# Patient Record
Sex: Female | Born: 1970 | Hispanic: No | Marital: Married | Smoking: Never smoker
Health system: Southern US, Community
[De-identification: ages and names within clinical notes are randomized; demographics above are authoritative.]

## PROBLEM LIST (undated history)

## (undated) DIAGNOSIS — E739 Lactose intolerance, unspecified: Secondary | ICD-10-CM

## (undated) DIAGNOSIS — D649 Anemia, unspecified: Secondary | ICD-10-CM

## (undated) DIAGNOSIS — E559 Vitamin D deficiency, unspecified: Secondary | ICD-10-CM

## (undated) DIAGNOSIS — J45909 Unspecified asthma, uncomplicated: Secondary | ICD-10-CM

## (undated) DIAGNOSIS — N809 Endometriosis, unspecified: Secondary | ICD-10-CM

## (undated) DIAGNOSIS — D219 Benign neoplasm of connective and other soft tissue, unspecified: Secondary | ICD-10-CM

## (undated) HISTORY — DX: Endometriosis, unspecified: N80.9

## (undated) HISTORY — DX: Unspecified asthma, uncomplicated: J45.909

## (undated) HISTORY — DX: Vitamin D deficiency, unspecified: E55.9

## (undated) HISTORY — DX: Benign neoplasm of connective and other soft tissue, unspecified: D21.9

## (undated) HISTORY — PX: ABDOMINAL HYSTERECTOMY: SHX81

## (undated) HISTORY — DX: Lactose intolerance, unspecified: E73.9

## (undated) HISTORY — DX: Anemia, unspecified: D64.9

---
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
## 2002-09-25 HISTORY — PX: CHOLECYSTECTOMY, LAPAROSCOPIC: SHX56

## 2006-02-01 ENCOUNTER — Ambulatory Visit: Payer: Self-pay | Admitting: Internal Medicine

## 2007-05-17 ENCOUNTER — Ambulatory Visit: Payer: Self-pay | Admitting: Internal Medicine

## 2007-05-24 ENCOUNTER — Ambulatory Visit: Payer: Self-pay | Admitting: Internal Medicine

## 2009-08-06 ENCOUNTER — Ambulatory Visit: Payer: Self-pay | Admitting: Internal Medicine

## 2009-09-25 HISTORY — PX: COLONOSCOPY: SHX174

## 2009-12-24 ENCOUNTER — Ambulatory Visit: Payer: Self-pay | Admitting: Unknown Physician Specialty

## 2010-10-21 ENCOUNTER — Ambulatory Visit: Payer: Self-pay

## 2010-11-01 ENCOUNTER — Ambulatory Visit: Payer: Self-pay

## 2011-12-11 ENCOUNTER — Ambulatory Visit: Payer: Self-pay

## 2011-12-15 ENCOUNTER — Ambulatory Visit: Payer: Self-pay

## 2012-12-11 ENCOUNTER — Ambulatory Visit: Payer: Self-pay

## 2013-09-10 ENCOUNTER — Ambulatory Visit: Payer: Self-pay | Admitting: Obstetrics and Gynecology

## 2013-09-10 LAB — WBC: WBC: 8.6 10*3/uL (ref 3.6–11.0)

## 2013-09-10 LAB — BASIC METABOLIC PANEL
Anion Gap: 6 — ABNORMAL LOW (ref 7–16)
Calcium, Total: 8.8 mg/dL (ref 8.5–10.1)
Chloride: 108 mmol/L — ABNORMAL HIGH (ref 98–107)
EGFR (African American): >60
Glucose: 77 mg/dL (ref 65–99)
Osmolality: 276 (ref 275–301)
Potassium: 3.5 mmol/L (ref 3.5–5.1)

## 2013-09-10 LAB — PREGNANCY, URINE: Pregnancy Test, Urine: NEGATIVE m[IU]/mL

## 2013-09-23 ENCOUNTER — Ambulatory Visit: Payer: Self-pay | Admitting: Obstetrics and Gynecology

## 2013-09-24 HISTORY — PX: LAPAROSCOPIC SUPRACERVICAL HYSTERECTOMY: SUR797

## 2013-09-24 LAB — CBC
HGB: 11 g/dL — ABNORMAL LOW (ref 12.0–16.0)
MCH: 30.3 pg (ref 26.0–34.0)
MCV: 88 fL (ref 80–100)
Platelet: 218 10*3/uL (ref 150–440)
RBC: 3.63 10*6/uL — ABNORMAL LOW (ref 3.80–5.20)

## 2013-10-28 HISTORY — PX: OOPHORECTOMY: SHX86

## 2013-11-06 ENCOUNTER — Observation Stay: Payer: Self-pay | Admitting: Obstetrics and Gynecology

## 2013-11-06 LAB — CBC
HCT: 41.6 % (ref 35.0–47.0)
HGB: 14.4 g/dL (ref 12.0–16.0)
MCH: 30.6 pg (ref 26.0–34.0)
MCHC: 34.6 g/dL (ref 32.0–36.0)
MCV: 89 fL (ref 80–100)
Platelet: 226 10*3/uL (ref 150–440)
RBC: 4.7 10*6/uL (ref 3.80–5.20)
RDW: 14 % (ref 11.5–14.5)
WBC: 10.9 10*3/uL (ref 3.6–11.0)

## 2013-11-06 LAB — URINALYSIS, COMPLETE
BILIRUBIN, UR: NEGATIVE
Bacteria: NONE SEEN
Blood: NEGATIVE
GLUCOSE, UR: NEGATIVE mg/dL (ref 0–75)
Leukocyte Esterase: NEGATIVE
Nitrite: NEGATIVE
PH: 7 (ref 4.5–8.0)
Protein: NEGATIVE
SPECIFIC GRAVITY: 1.018 (ref 1.003–1.030)

## 2013-11-06 LAB — COMPREHENSIVE METABOLIC PANEL
Albumin: 3.6 g/dL (ref 3.4–5.0)
Alkaline Phosphatase: 74 U/L
Anion Gap: 9 (ref 7–16)
BILIRUBIN TOTAL: 0.6 mg/dL (ref 0.2–1.0)
BUN: 12 mg/dL (ref 7–18)
Calcium, Total: 8.7 mg/dL (ref 8.5–10.1)
Chloride: 105 mmol/L (ref 98–107)
Co2: 22 mmol/L (ref 21–32)
Creatinine: 0.92 mg/dL (ref 0.60–1.30)
EGFR (African American): >60
EGFR (Non-African Amer.): >60
Glucose: 101 mg/dL — ABNORMAL HIGH (ref 65–99)
Osmolality: 272 (ref 275–301)
POTASSIUM: 3 mmol/L — AB (ref 3.5–5.1)
SGOT(AST): 22 U/L (ref 15–37)
SGPT (ALT): 21 U/L (ref 12–78)
SODIUM: 136 mmol/L (ref 136–145)
TOTAL PROTEIN: 7.4 g/dL (ref 6.4–8.2)

## 2013-11-06 LAB — POTASSIUM: POTASSIUM: 3.7 mmol/L (ref 3.5–5.1)

## 2013-11-06 LAB — PREGNANCY, URINE: PREGNANCY TEST, URINE: NEGATIVE m[IU]/mL

## 2013-11-06 LAB — LIPASE, BLOOD: Lipase: 74 U/L (ref 73–393)

## 2013-11-07 LAB — HEMATOCRIT: HCT: 33.7 % — ABNORMAL LOW (ref 35.0–47.0)

## 2013-11-12 LAB — PATHOLOGY REPORT

## 2013-12-18 ENCOUNTER — Ambulatory Visit: Payer: Self-pay

## 2014-03-25 IMAGING — US US ARTFLOW
1 series · 13 of 16 positions shown · non-contrast
Comparison: CT Abdomen and Pelvis 11/06/2013. Pelvis ultrasound
05/17/2007.

CLINICAL DATA: 42-year-old female with pelvic free fluid and
possible abnormal left ovary by CT performed for abdominal and
pelvic pain. Initial encounter. Prior hysterectomy.

EXAM:
TRANSABDOMINAL AND TRANSVAGINAL ULTRASOUND OF PELVIS
DOPPLER ULTRASOUND OF OVARIES
TECHNIQUE: Both transabdominal and transvaginal ultrasound examinations of the
pelvis were performed. Transabdominal technique was performed for
global imaging of the pelvis including uterus, ovaries, adnexal
regions, and pelvic cul-de-sac.
It was necessary to proceed with endovaginal exam following the
transabdominal exam to visualize the ovaries. Color and duplex
Doppler ultrasound was utilized to evaluate blood flow to the
ovaries.

[Series 1: us artflow · 0.28mm/px · 13 of 117 slices shown]
[im 1/117]
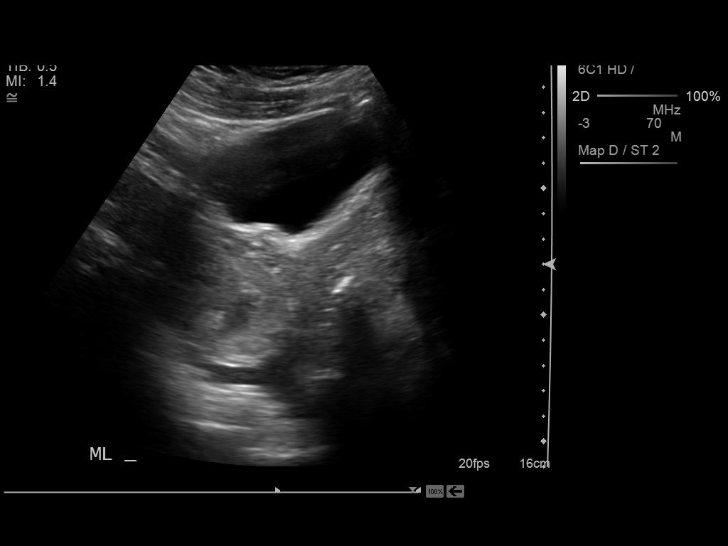
[im 8/117]
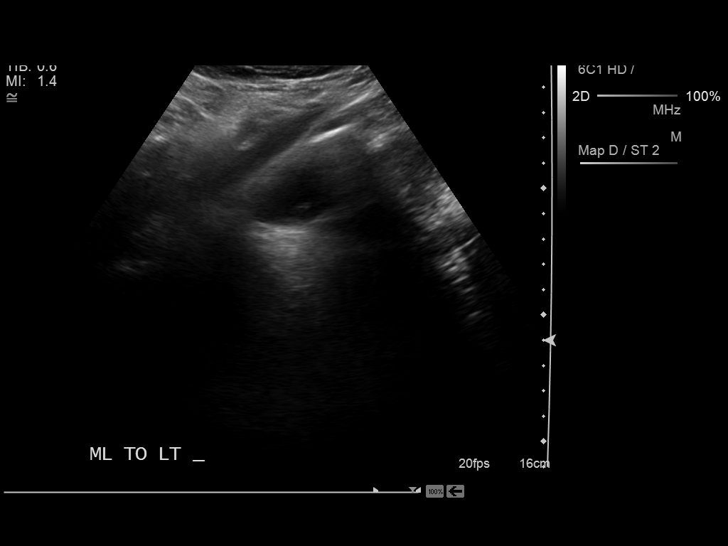
[im 24/117]
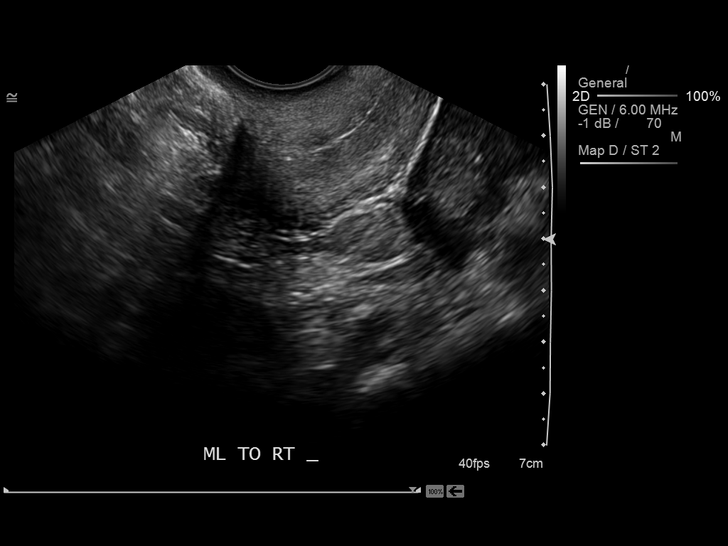
[im 31/117]
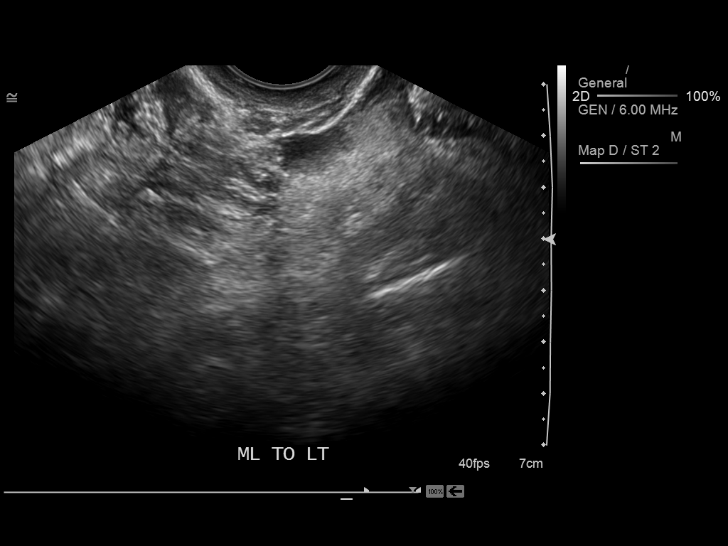
[im 39/117]
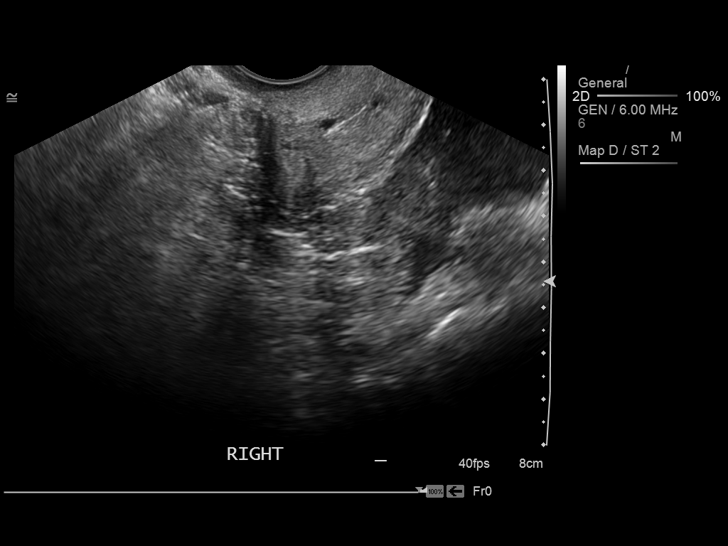
[im 47/117]
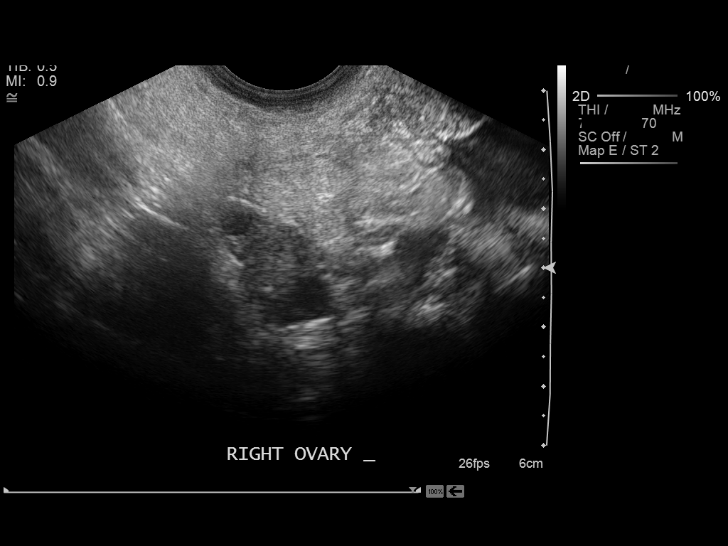
[im 62/117]
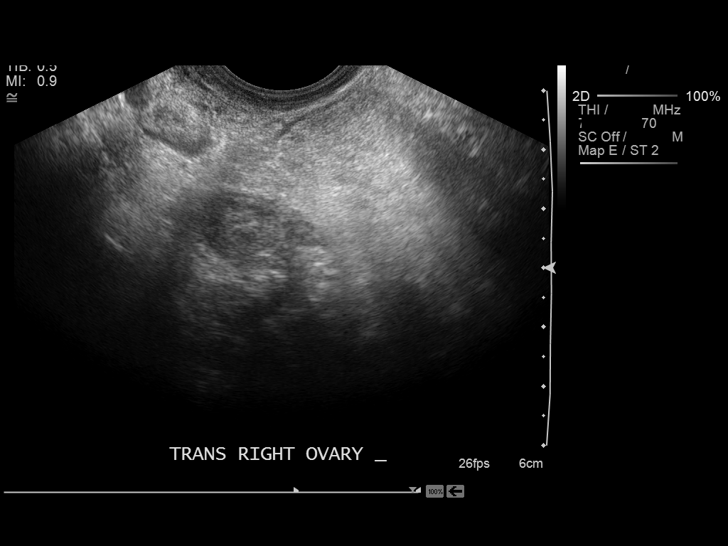
[im 70/117]
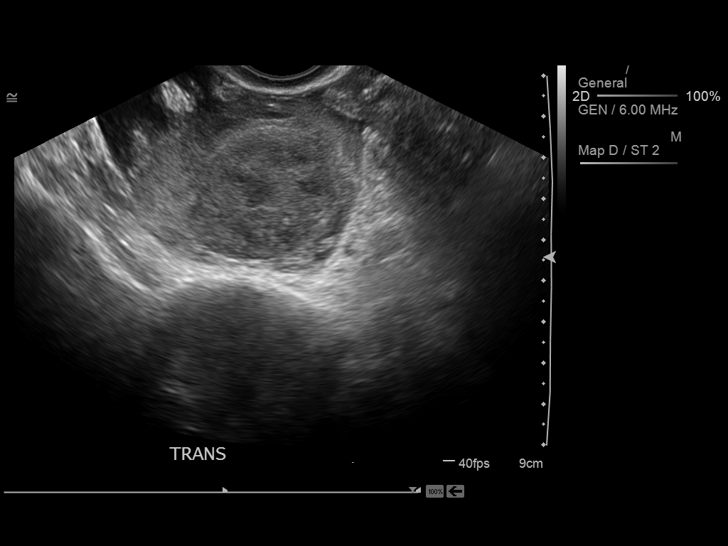
[im 78/117]
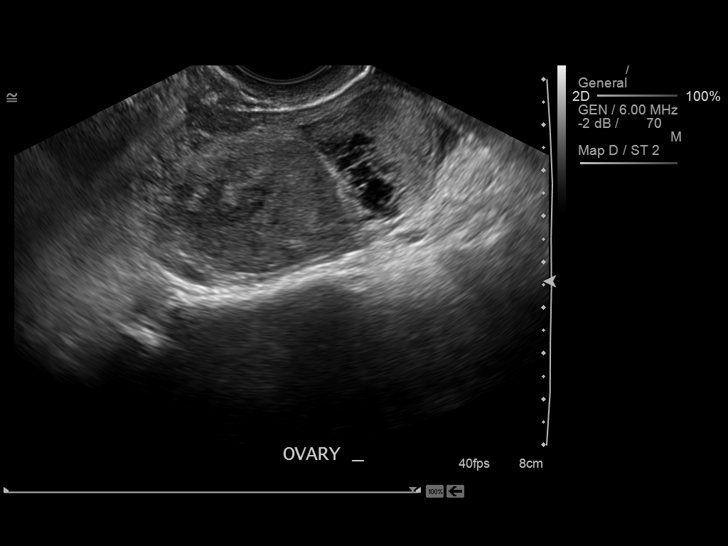
[im 86/117]
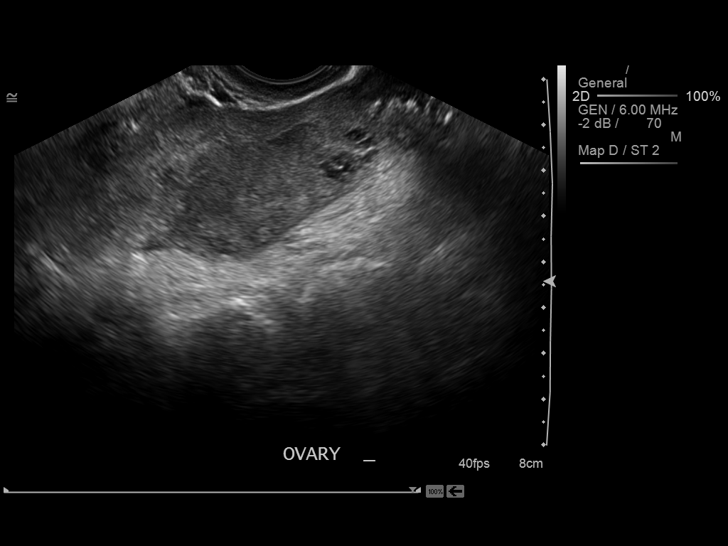
[im 93/117]
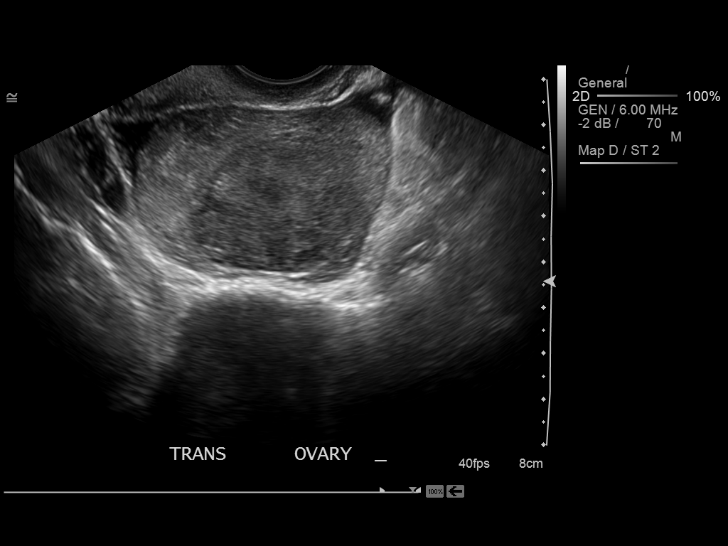
[im 109/117]
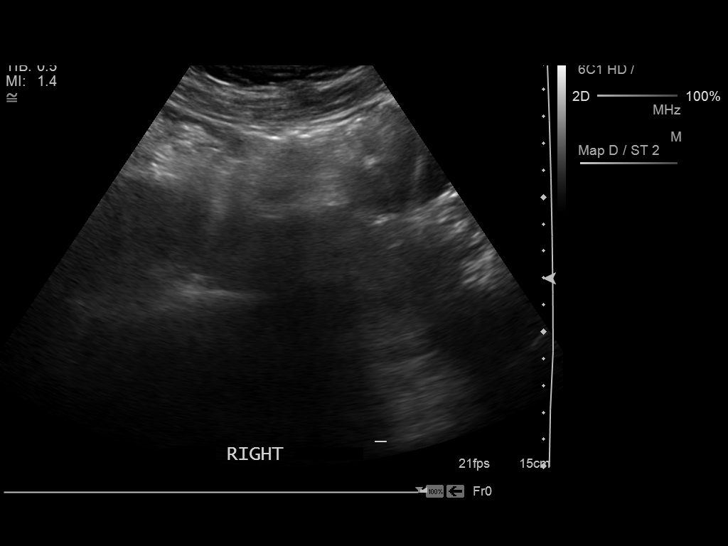
[im 117/117]
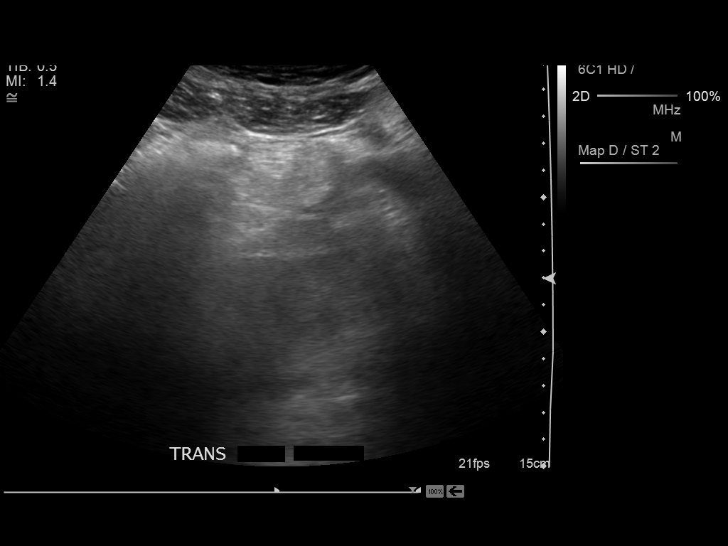

[13 of 16 positions shown; findings below may reference images not displayed]

FINDINGS: Uterus

Measurements: Surgically absent.

Endometrium

Thickness: Surgically absent.

Right ovary

Measurements: 2.5 x 1.3 x 2.0 cm. Normal appearance/no adnexal mass.
Normal low resistance arterial and venous spectral Doppler
waveforms.

Left ovary

Measurements: 7.3 x 3.5 x 5.8 cm. Enlarged and very complex/
heterogeneous in appearance. Color Doppler and spectral not blur
analysis demonstrates detectable blood flow primarily along the
periphery. Arterial waveforms are very low resistance (image 87).
Centrally there is little to no evidence of vascularity (image 102).

Other findings

Moderate volume of fluid with low level internal echoes.
IMPRESSION: 1. Markedly abnormal left ovary is enlarged (up to 7 cm) and very
complex / heterogeneous. And although there is some preserved
peripheral vascularity, the detected arterial waveform is
asymmetric, and there is no central vascularity identified. Favor
sequelae of left ovarian torsion.
Less likely consideration is a large (6 cm) hemorrhagic cyst. Left
ovarian neoplasm is less likely still given the absence of central
vascularity.
Recommend GYN surgical evaluation.
2. Moderate volume pelvic free fluid.
3. Small, normal right ovary.  Uterus surgically absent.
Study discussed by telephone with Dzida Sarajlija, DO on

## 2014-03-25 IMAGING — CT CT ABD-PELV W/ CM
2 of 5 series · 16 of 46 positions shown, 18 images · IV contrast (isovue)
Comparison: Pelvic ultrasound May 17, 2007

CLINICAL DATA: Abdominal and pelvic pain

EXAM:
CT ABDOMEN AND PELVIS WITH CONTRAST
TECHNIQUE: Multidetector CT imaging of the abdomen and pelvis was performed
using the standard protocol following bolus administration of
intravenous contrast.
CONTRAST:  100 mL Isovue 370 nonionic

[Series 2: routine abd pel with · axial · 0.71mm/px · z∈[-799,-454]mm · 13 of 81 slices shown, 15 images]
[im 6/81  soft-tissue]
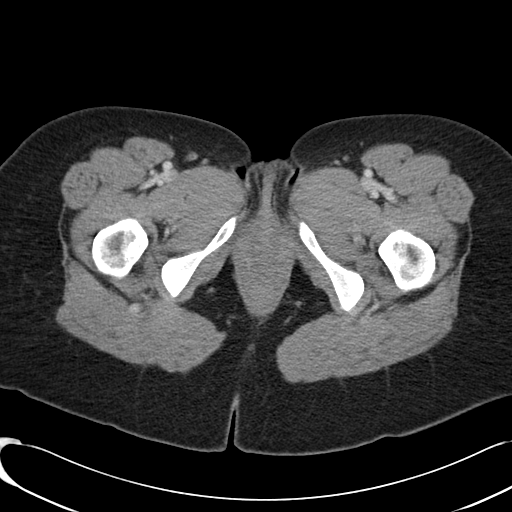
[im 6/81  bone]
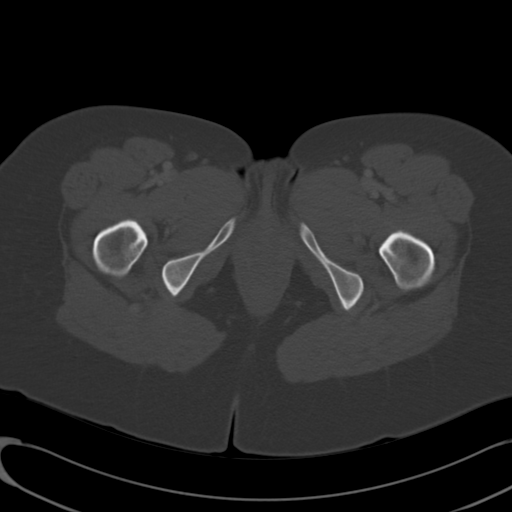
[im 12/81  soft-tissue]
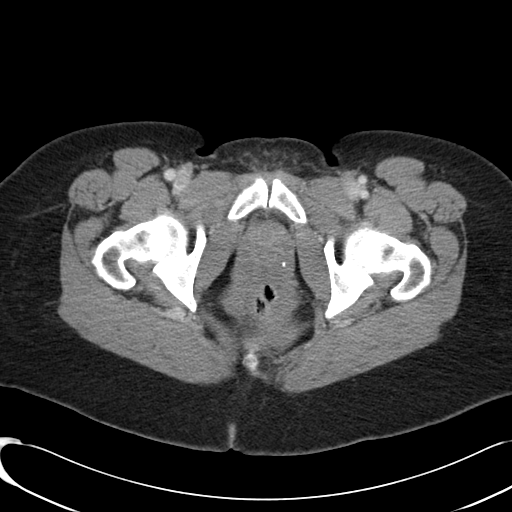
[im 18/81  soft-tissue]
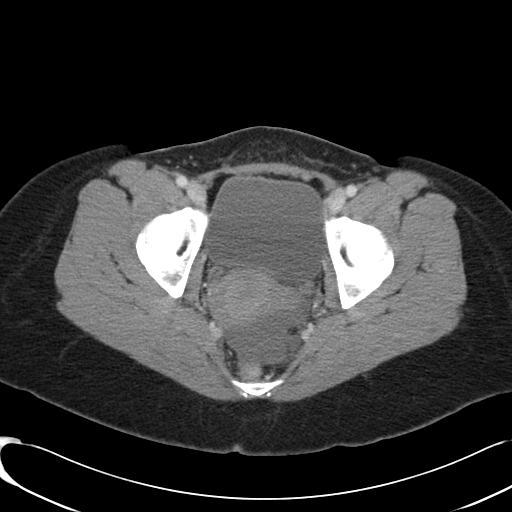
[im 23/81  soft-tissue]
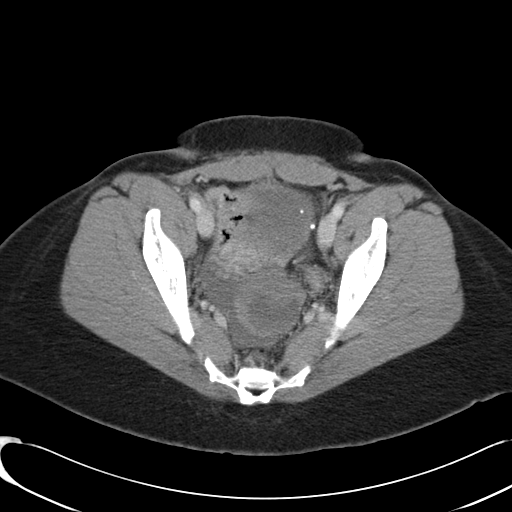
[im 29/81  soft-tissue]
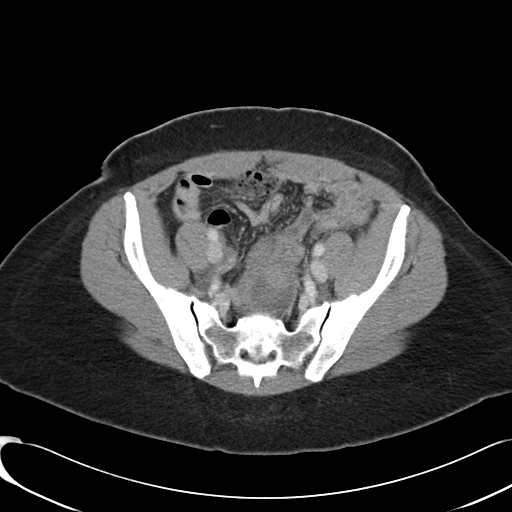
[im 35/81  soft-tissue]
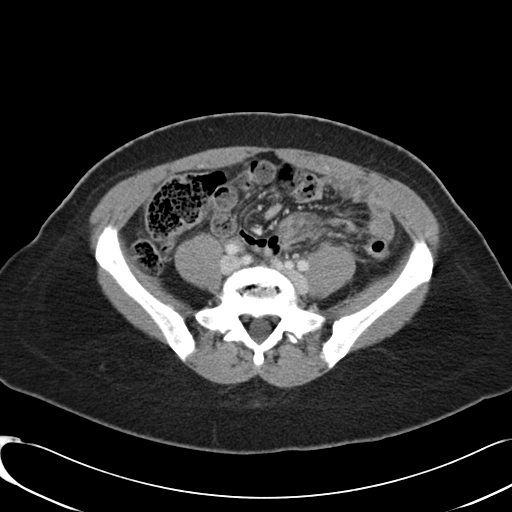
[im 41/81  soft-tissue]
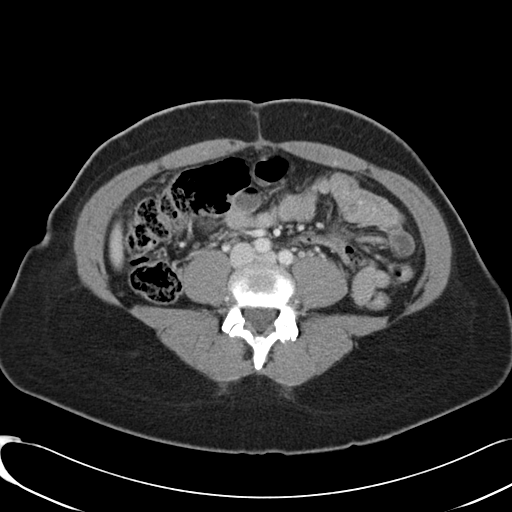
[im 46/81  soft-tissue]
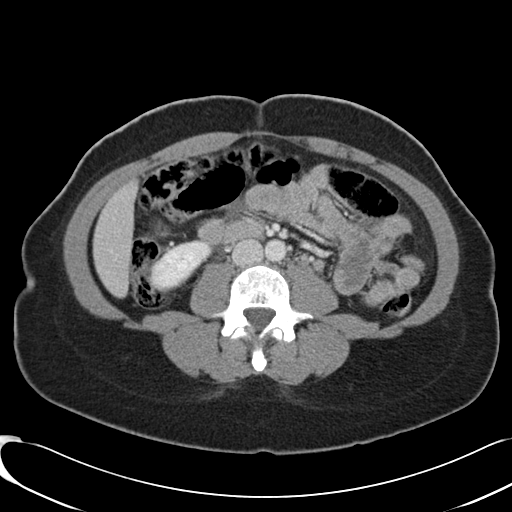
[im 52/81  soft-tissue]
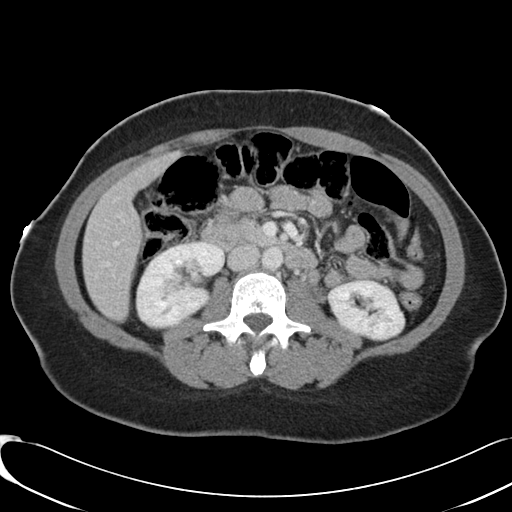
[im 52/81  bone]
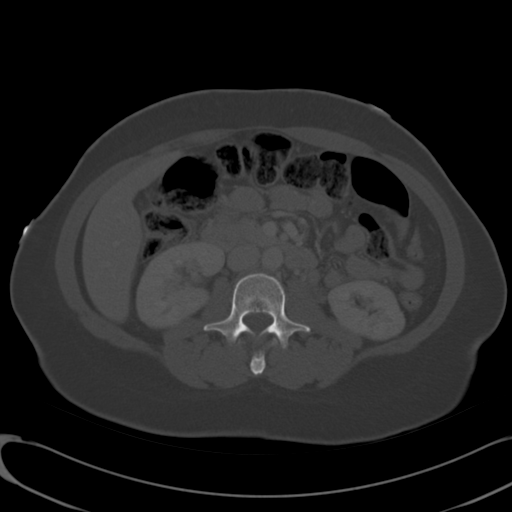
[im 58/81  soft-tissue]
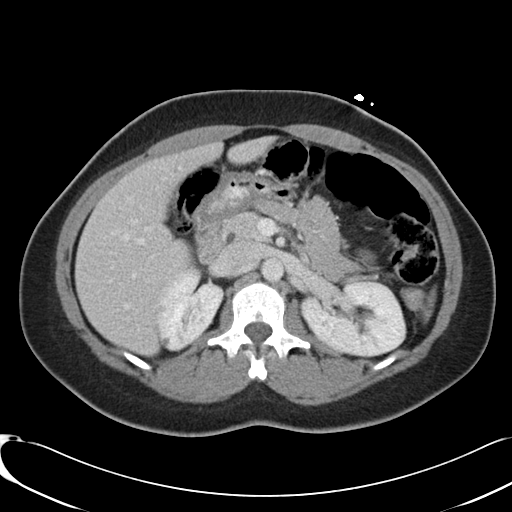
[im 63/81  soft-tissue]
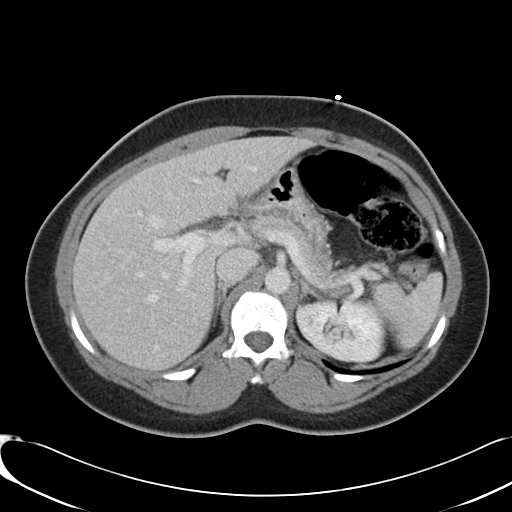
[im 69/81  soft-tissue]
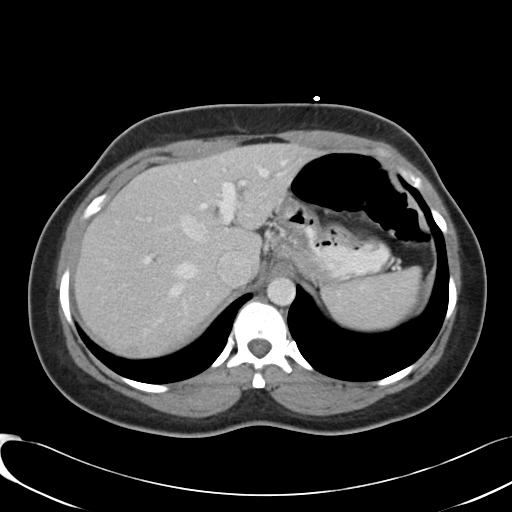
[im 75/81  soft-tissue]
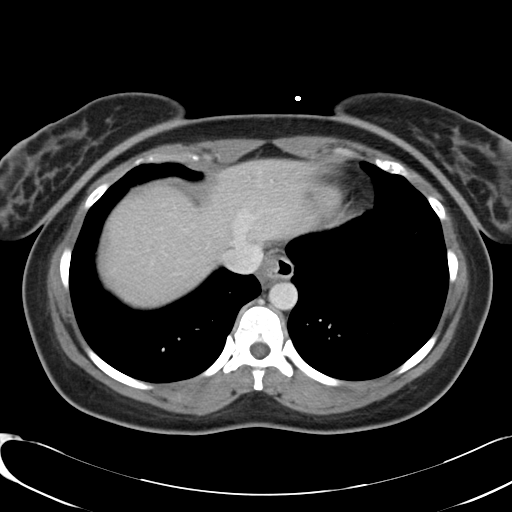

[Series 6: cor routine abd pel with · coronal · 0.68mm/px · 3 of 125 slices shown]
[im 42/125  soft-tissue]
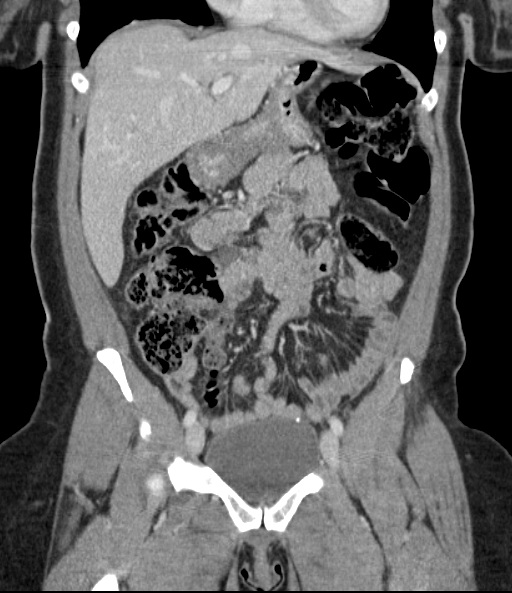
[im 56/125  soft-tissue]
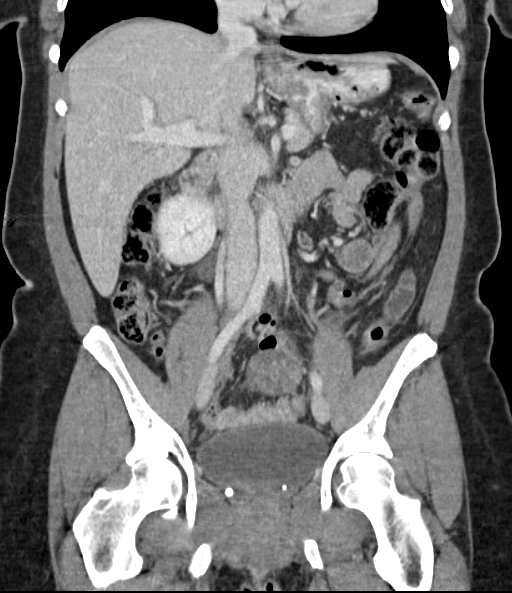
[im 69/125  soft-tissue]
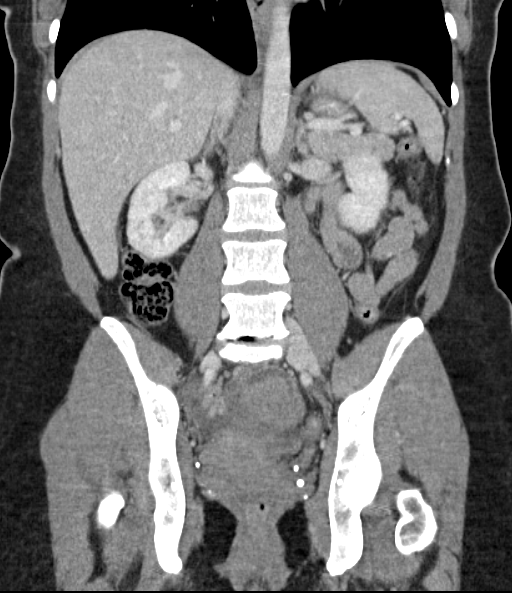

[16 of 46 positions shown; findings below may reference images not displayed]

FINDINGS: Lung bases are clear.  There is a small hiatal hernia.

Liver is prominent measuring 17.6 cm in length. No focal liver
lesions are identified. Gallbladder is absent. There is no
appreciable biliary duct dilatation.

Spleen, pancreas, and adrenals appear normal.

There is a 7 x 7 mm cyst in the periphery of the mid right kidney.
No noncystic masses are identified in either kidney. There is no
hydronephrosis in either kidney. There is no ureteral calculus or
ureterectasis on either side.

In the pelvis, urinary bladder is midline with normal wall
thickness. There is moderate free fluid throughout the pelvis with
some fluid extending into the upper abdomen. The uterus is not seen.
There is enlargement of the left ovary measuring 4.2 by 3.8 cm. The
left ovary has slightly decreased attenuation. There is surrounding
fluid in the region of the left ovary. Right ovary is not well seen.

Appendix appears normal.

There is no bowel obstruction.  No free air or portal venous air.

There is a minimal ventral hernia containing only fat.

There is no adenopathy or abscess in the abdomen or pelvis. Aorta is
nonaneurysmal. There is marked arthropathy at L5-S1 with vacuum
phenomenon at this level. There are no lytic bone lesions. There is
an apparent small bone island in the mid sacrum. A second presumed
small bone island is noted at L4.
IMPRESSION: There is moderate free fluid in the pelvis. Note that the left ovary
appears prominent with slight decreased attenuation. The possibility
of ovarian torsion must be of concern given this appearance.
Correlation with pelvic ultrasound including Doppler evaluation of
the ovaries is warranted.

Uterus and gallbladder absent.

Liver prominent but otherwise normal.

Small hiatal hernia.  Small ventral hernia containing only fat.

## 2015-01-16 NOTE — H&P (Signed)
Subjective/Chief Complaint flank pain   History of Present Illness 44 yo [id number] female who is 6 weeks s/p laparoscopic supracervical hysterectomy by Dr. Erik Obey.  She awoke this morning with mild pain in her left side.  This was at about 5am.  By 8am she really began having pain.  She also has had nausea and vomiting.  The pain is on her left lower quadrant and flank and had localized to this region by the time she arrived to work.  The pain is described as "serious cramping." Putting pressure on the flank makes it feel better.  Nothing aggravates it. The pain is rated  as 8 out of 10.  She denies fevers, chills, chest pain, shortness of breath, and diarrhea.   Past History Past medical History: none Past Surgical History:  1) laparoscopic supracervical hysterectomy, bilateral salpingectomy 09/23/2013 2) c-section x 2 3) laparoscopic cholecystectomy   ALLERGIES:  No Known Allergies:   Family and Social History:  Family History Non-Contributory   Social History negative tobacco, negative ETOH, negative Illicit drugs   Place of Living Home   Review of Systems:  Fever/Chills No   Cough No   Sputum No   Abdominal Pain Yes   Diarrhea No   Constipation No   Nausea/Vomiting Yes   SOB/DOE No   Chest Pain No   Dysuria No   Physical Exam:  GEN well developed, well nourished, moderate distress   NECK supple  trachea midline   RESP normal resp effort  clear BS   CARD regular rate   ABD positive Flank Tenderness  normal BS  soft, tender to palpation in RLQ, with moderate rebound, no guarding, +BS   GU no superpubic tenderness   EXTR negative cyanosis/clubbing, negative edema   SKIN normal to palpation   NEURO cranial nerves intact, motor/sensory function intact   PSYCH A+O to time, place, person, good insight   Lab Results: Hepatic:  12-Feb-15 09:58   Bilirubin, Total 0.6  Alkaline Phosphatase 74 (45-117 NOTE: New Reference Range 08/15/13)  SGPT  (ALT) 21  SGOT (AST) 22  Total Protein, Serum 7.4  Albumin, Serum 3.6  Routine Chem:  12-Feb-15 09:58   Lipase 74 (Result(s) reported on 06 Nov 2013 at 10:25AM.)  BUN 12  Creatinine (comp) 0.92  Sodium, Serum 136  Potassium, Serum  3.0  Chloride, Serum 105  CO2, Serum 22  Calcium (Total), Serum 8.7  Osmolality (calc) 272  eGFR (African American) >60  eGFR (Non-African American) >60 (eGFR values <42m/min/1.73 m2 may be an indication of chronic kidney disease (CKD). Calculated eGFR is useful in patients with stable renal function. The eGFR calculation will not be reliable in acutely ill patients when serum creatinine is changing rapidly. It is not useful in  patients on dialysis. The eGFR calculation may not be applicable to patients at the low and high extremes of body sizes, pregnant women, and vegetarians.)  Anion Gap 9  Routine UA:  12-Feb-15 09:58   Color (UA) Yellow  Clarity (UA) Clear  Glucose (UA) Negative  Bilirubin (UA) Negative  Ketones (UA) Trace  Specific Gravity (UA) 1.018  Blood (UA) Negative  pH (UA) 7.0  Protein (UA) Negative  Nitrite (UA) Negative  Leukocyte Esterase (UA) Negative (Result(s) reported on 06 Nov 2013 at 11:29AM.)  RBC (UA) <1 /HPF  WBC (UA) <1 /HPF  Bacteria (UA) NONE SEEN  Epithelial Cells (UA) 1 /HPF  Mucous (UA) PRESENT (Result(s) reported on 06 Nov 2013 at 11:29AM.)  Routine  Sero:  12-Feb-15 09:58   Pregnancy Test, Urine NEGATIVE (The results of the qualitative urine HCG (Pregnancy Test) should be evaluated in light of other clinical information.  There are limitations to the test which, in certain clinical situations, may result in a false positive or negative result. Thehigh dose hook effect can occur in urine samples with extremely high HCG concentrations.  This effect can produce a negative result in certain situations. It is suggested that results of the qualitative HCG be confirmed by an alternate methodology, such as the  quantitative serum beta HCG test.)  Routine Hem:  12-Feb-15 09:58   WBC (CBC) 10.9  RBC (CBC) 4.70  Hemoglobin (CBC) 14.4  Hematocrit (CBC) 41.6  Platelet Count (CBC) 226 (Result(s) reported on 06 Nov 2013 at 10:19AM.)  MCV 89  MCH 30.6  MCHC 34.6  RDW 14.0   Radiology Results: [place]:    12-Feb-15 15:46, US Pelvis Ultrasound Exam with Transvaginal - NON-OB  US Pelvis Ultrasound Exam with Transvaginal - NON-OB  REASON FOR EXAM:    ?r/o torsion. left ovary edema/attenuated on CT  COMMENTS:   May transport without cardiac monitor    PROCEDURE: [place]  - US PELVIS EXAM W/TRANSVAGINAL  - Nov 06 2013  3:46PM     CLINICAL DATA:  44 year old female with pelvic free fluid and  possible abnormal left ovary by CT performed for abdominal and  pelvic pain. Initial encounter. Prior hysterectomy.    EXAM:  TRANSABDOMINAL AND TRANSVAGINAL ULTRASOUND OF PELVIS    DOPPLER ULTRASOUND OF OVARIES    TECHNIQUE:  Both transabdominal and transvaginal ultrasound examinations of the  pelvis were performed. Transabdominal technique was performed for  global imaging of the pelvis including uterus, ovaries, adnexal  regions, and pelvic cul-de-sac.    It was necessary to proceed with endovaginal exam following the  transabdominal exam to visualize the ovaries. Color and duplex  Doppler ultrasound was utilized to evaluate blood flow to the  ovaries.    COMPARISON:  CT Abdomen and Pelvis 11/06/2013. Pelvis ultrasound  05/17/2007.    FINDINGS:  Uterus  Measurements: Surgically absent.    Endometrium    Thickness: Surgically absent.    Right ovary    Measurements: 2.5 x 1.3 x 2.0 cm. Normal appearance/no adnexal mass.  Normal low resistance arterial and venous spectral Doppler  waveforms.    Left ovary    Measurements: 7.3 x 3.5 x 5.8 cm. Enlarged and very complex/  heterogeneous in appearance. Color Doppler and spectral not blur  analysis demonstrates detectable blood flow primarily along  the  periphery. Arterial waveformsare very low resistance (image 87).  Centrally there is little to no evidence of vascularity (image 102).    Other findings    Moderate volume of fluid with low level internal echoes.     IMPRESSION:  1. Markedly abnormal left ovary is enlarged (up to 7 cm) and very  complex / heterogeneous. And although there is some preserved  peripheral vascularity, the detected arterial waveform is  asymmetric, and there is no central vascularity identified. Favor  sequelae of left ovarian torsion.  Less likely consideration is a large (6 cm) hemorrhagic cyst. Left  ovarian neoplasm is less likely still given the absence of central  vascularity.  Recommend GYN surgical evaluation.  2. Moderate volume pelvic free fluid.  3. Small, normal right ovary.  Uterus surgically absent.  Study discussed by telephone with Sheryl L. Benjaman Lobe, DO on  11/06/2013 at 16:21 .  Electronically Signed    By: Lars Pinks M.D.    On: 11/06/2013 16:27       Verified By: Gwenyth Bender. HALL, M.D.,    Assessment/Admission Diagnosis Left ovarian torsion   Plan admit for emergent surgery to reduce torsion.  ultrasound concerning for ischemia/necrosis.  Discussed with family and patient that this is likely torsion.  Discussed possibility that other diagnoses are possible, like hemorrhagic cyst. Discussed risks/benefits/alternatives of surgery and stronly recommended surgery.  I personally reviewed the risks of surgery and personally consented her for a blood transfusion, to which she agreed, should one be necessary in an emergency situation.  Patient has discussed with her husband and both agree that surgery is the best option. OR aware.  K+ has been repleted and redrawn as it was 3.0 initially.   Electronic Signatures: Will Bonnet (MD)  (Signed 12-Feb-15 18:21)  Authored: CHIEF COMPLAINT and HISTORY, ALLERGIES, FAMILY AND SOCIAL HISTORY, REVIEW OF SYSTEMS, PHYSICAL EXAM, LABS,  Radiology, ASSESSMENT AND PLAN   Last Updated: 12-Feb-15 18:21 by Will Bonnet (MD)

## 2015-01-16 NOTE — Op Note (Signed)
PATIENT NAME:  Jennifer White, Jennifer White MR#:  [id number] DATE OF BIRTH:  [birth year]  DATE OF PROCEDURE:  11/06/2013  PREOPERATIVE DIAGNOSES: Suspected left ovarian torsion.   POSTOPERATIVE DIAGNOSIS: Left ovarian torsion.   PROCEDURE: Laparoscopic left oophorectomy.   ANESTHESIA: General.  SURGEON: Will Bonnet, MD  ESTIMATED BLOOD LOSS: 25 mL.   OPERATIVE FLUIDS: 1000 mL crystalloid.   COMPLICATIONS: None.   FINDINGS:  1. Grossly enlarged and necrotic left ovary.  2. Edematous left infundibulopelvic ligament.   SPECIMENS: Left ovary.   CONDITION AT THE END OF PROCEDURE: Stable.   PROCEDURE IN DETAIL: The patient was taken to the operating room, where general anesthesia was administered and found to be adequate. She was placed in the dorsal supine position and prepped and draped in usual sterile fashion. After a timeout was called, a 5 mm infraumbilical incision was made with a scalpel, and the abdomen was entered using a direct visualization through the camera using an Optiview trocar. Successful entrance was verified into the abdomen by opening pressure. The abdomen was then insufflated with CO2. After insufflation of CO2, the camera was reintroduced, and atraumatic entry was verified. Next, under direct intra-abdominal camera visualization, a left lower quadrant 5 mm port was placed without difficulty, and a left 10 mm port was placed without difficulty in the right lower quadrant. The pelvis was surveyed, with the above-noted findings. The left ovary was de-torsed, and time was given for recovery of the ovary; however, the ovary was grossly edematous and grossly necrotic, and there was no true areas of viable tissue remaining. At this point, a decision was made to remove the ovary. The infundibulopelvic was identified and dissected away from any nearby tissue. Using the Kleppinger bipolar electrocautery, the left infundibulopelvic ligament was cauterized and then transected using the  Harmonic. The Endo Catch bag was then introduced through the left lower quadrant port, and the ovary and specimen were placed into the bag and were successfully removed through the left lower quadrant port. Hemostasis was noted at the site of transection of the IP ligament. The abdomen was then copiously irrigated. At this point, the procedure was terminated, and abdomen was desufflated of CO2. All ports were removed without difficulty. The 10 mm port skin site was closed using subcuticular stitch, and then the incision was reinforced using Dermabond. The other 5 mm ports were reapproximated using a subcutaneous stitch, and then the skin was reapproximated and reinforced using Dermabond.   The patient tolerated the procedure well. Sponge, lap and needle counts were correct x2. For VTE prophylaxis, the patient was wearing pneumatic compression stockings throughout the entire procedure. She was awakened in the OR and transported to the recovery area in stable condition.   ____________________________ Will Bonnet, MD sdj:lb D: 11/22/2013 11:09:21 ET T: 11/22/2013 12:18:34 ET JOB#: [id number]  cc: Will Bonnet, MD, <Dictator> Will Bonnet MD ELECTRONICALLY SIGNED 11/23/2013 15:33

## 2015-01-16 NOTE — Op Note (Signed)
PATIENT NAME:  Jennifer White, ROBLERO MR#:  [id number] DATE OF BIRTH:  05/10/1971  DATE OF PROCEDURE:  09/24/2013  PREOPERATIVE DIAGNOSIS:  Large growing fibroid causing discomfort, dysmenorrhea and heavy periods, unresponsive to medical management.   POSTOPERATIVE DIAGNOSES:  1.  Large growing fibroid causing discomfort, dysmenorrhea and heavy periods, unresponsive to medical management.  2.  Endometriosis.   PROCEDURE:  Laparoscopic supracervical hysterectomy, bilateral salpingectomy, mini laparotomy and cystoscopy.     SURGEON: Delsa Sale, M.D.   ASSISTANT: Prentice Docker, M.D.   ESTIMATED BLOOD LOSS: 50 mL.   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 Efflux of urine from both ureters bilaterally. Normal abdominal survey.    DESCRIPTION OF PROCEDURE:  The patient was taken to the Operating Room and placed in a supine position. After adequate general endotracheal anesthesia was instilled, the patient was prepped and draped in the usual sterile fashion. A side-opening speculum was in the patient's vagina. The anterior lip of the cervix was grasped with a single-tooth tenaculum. Hulka tenaculum was placed, speculum and single-tooth tenaculum were removed.    Attention was then turned to the patient's umbilicus which was injected with Marcaine. The incision was made, Veress needle was placed. Hang drop test, fluid instillation test and fluid aspiration test showed proper placement of the Veress needle. CO2 was placed on low flow. When tympany was heard around the liver, CO2 was placed on high flow. The Veress needle was then removed. The 10 mm trocar port was placed with the camera under direct visualization. The aforementioned findings were seen. Two lower 5 mm trocars were placed in the right and left lower quadrant under direct visualization.  The sides of the uterus were doused with Marcaine and the Harmonic  scalpel was placed into the abdomen. First the right, then the left tube was removed from the ovary and found to be peritoneal reflection at the tubo-ovarian broad ligament. This was also seen at the left side. Serial bites were then taken down the sides of the round ligament and then in between the uterus and the ovary to detach the ovary from the uterus. A bladder flap was created, bites of tissue were taken down to the uterine arteries which were then grasped with a Kleppinger and cauterized, first on the left and then the right. Then the Harmonic scalpel was placed and the uterus was detached from the cervix at the uterocervical junction above the uterosacral ligament. The uterus was detached. The anterior os of the cervix was cauterized. The pelvis was irrigated and found to be hemostatic. A minilaparotomy of the ureters were identified but there was decreased flow and inability to truly see the ureter on the right. The minilaparotomy was performed through the old C-section scar. The uterus was grasped and delivered through this small incision. The incision was replaced back in layers. The muscle bellies were approximated. The On-Q trocar catheters were placed, they were wrapped around the inside between the muscles and the fascia. The fascia was closed with a running Vicryl suture, 4-0 Monocryl was used to approximate the skin edges. Dermabond and Steri-Strips were placed. A 4 x 4 was used to attach the On-Q catheters to the abdomen.   Attention was then turned to the bladder where the Foley catheter was removed and the cystoscope was placed. The ureters were identified. Efflux was seen first on the left and then the right ureter. Photographs were taken. The cystoscope was removed. The patient  was laid supine after replacing a Foley catheter into her bladder. Incisions were bandaged with Tegaderm and Telfa. The patient was then taken to recovery after having tolerated the procedure well.    ____________________________ Delsa Sale, MD cck:cs D: 09/24/2013 10:05:32 ET T: 09/24/2013 14:07:46 ET JOB#: [id number]  cc: Delsa Sale, MD, <Dictator> Delsa Sale MD ELECTRONICALLY SIGNED 09/25/2013 14:55

## 2016-12-11 ENCOUNTER — Other Ambulatory Visit: Payer: Self-pay | Admitting: Nurse Practitioner

## 2016-12-11 ENCOUNTER — Other Ambulatory Visit: Payer: Self-pay | Admitting: Internal Medicine

## 2016-12-11 ENCOUNTER — Other Ambulatory Visit: Payer: Self-pay | Admitting: Certified Nurse Midwife

## 2016-12-11 DIAGNOSIS — Z1231 Encounter for screening mammogram for malignant neoplasm of breast: Secondary | ICD-10-CM

## 2016-12-25 ENCOUNTER — Ambulatory Visit
Admission: RE | Admit: 2016-12-25 | Discharge: 2016-12-25 | Disposition: A | Discharge status: Home / Self Care | Payer: 59 | Source: Ambulatory Visit | Attending: Nurse Practitioner | Admitting: Nurse Practitioner

## 2016-12-25 ENCOUNTER — Other Ambulatory Visit: Payer: Self-pay | Admitting: Nurse Practitioner

## 2016-12-25 DIAGNOSIS — R053 Chronic cough: Secondary | ICD-10-CM

## 2016-12-25 DIAGNOSIS — R05 Cough: Secondary | ICD-10-CM

## 2017-01-05 ENCOUNTER — Ambulatory Visit
Admission: RE | Admit: 2017-01-05 | Discharge: 2017-01-05 | Disposition: A | Discharge status: Home / Self Care | Payer: 59 | Source: Ambulatory Visit | Attending: Nurse Practitioner | Admitting: Nurse Practitioner

## 2017-01-05 ENCOUNTER — Encounter: Payer: Self-pay | Admitting: Certified Nurse Midwife

## 2017-01-05 ENCOUNTER — Ambulatory Visit (INDEPENDENT_AMBULATORY_CARE_PROVIDER_SITE_OTHER): Payer: 59 | Admitting: Certified Nurse Midwife

## 2017-01-05 VITALS — BP 100/60 | HR 82 | Ht 61.0 in | Wt 164.0 lb

## 2017-01-05 DIAGNOSIS — Z01419 Encounter for gynecological examination (general) (routine) without abnormal findings: Secondary | ICD-10-CM

## 2017-01-05 DIAGNOSIS — Z124 Encounter for screening for malignant neoplasm of cervix: Secondary | ICD-10-CM | POA: Diagnosis not present

## 2017-01-05 DIAGNOSIS — Z1231 Encounter for screening mammogram for malignant neoplasm of breast: Secondary | ICD-10-CM | POA: Diagnosis present

## 2017-01-05 NOTE — Progress Notes (Signed)
Gynecology Annual Exam  PCP: Ronnell Freshwater, NP  Chief Complaint:  Chief Complaint  Patient presents with  . Gynecologic Exam    History of Present Illness:  Jennifer White. Tschida is a 46 year old African American/Black female, [place], who presents for her annual exam. She is having problems which include hot flashes and night sweats. Taking vitamin E for hot flashes, but has still has bothersome hot flashes. Her menses are absent due to a laprascopic supracervical hysterectomy for fibroids/menorrhagia in 2014.  She has had no spotting.   The patient's past medical history is detailed in the past medical history section. She had a left oophorectomy in 2015 for an ovarian torsion, but still has her right ovary. Since her last annual GYN exam dated 12/24/2015, she reports being in good health. She is active and has been exercising, but has not been able to lose weight. She is sexually active.  Her most recent pap smear was obtained 12/24/2015 and was negative.  Her most recent mammogram obtained on 03/31/2017was normal. She had another screening mammogram today and is waiting results. There is no family history of breast cancer.  There is no family history of ovarian cancer.  The patient does  do monthly self breast exams.  She had a colonoscopy in 2011 which showed polyps. She is unsure when she is to have a repeat colonoscopy. The patient had a DEXA scan at [facility] which showed osteopenia. The patient does not smoke.  The patient does not drink alcohol.  The patient does not use illegal drugs.  The patient exercises occasionally and tries to walk 15 min twice a day.  Current BMI=30.99 kg/m2. She is completing her associate degree at [employer] this May then will start on her bachelor's degree.  The patient does get adequate calcium in her diet and she takes a vitamin D3 supplement. She had a recent cholesterol screen in 2018 and results are pending.    Review of Systems:  Review of Systems  Constitutional: Negative for chills, fever and weight loss.  HENT: Negative for congestion, sinus pain and sore throat.   Eyes: Negative for blurred vision and pain.  Respiratory: Negative for hemoptysis, shortness of breath and wheezing.   Cardiovascular: Negative for chest pain, palpitations and leg swelling.  Gastrointestinal: Negative for abdominal pain, blood in stool, diarrhea, heartburn, nausea and vomiting.  Genitourinary: Negative for dysuria, frequency, hematuria and urgency.  Musculoskeletal: Negative for back pain, joint pain and myalgias.  Skin: Negative for itching and rash.  Neurological: Negative for dizziness, tingling and headaches.  Endo/Heme/Allergies: Negative for environmental allergies and polydipsia. Does not bruise/bleed easily.       Negative for hirsutism. Positive for hot flashes   Psychiatric/Behavioral: Negative for depression. The patient is not nervous/anxious and does not have insomnia.     Past Medical History:  Past Medical History:  Diagnosis Date  . Anemia   . Endometriosis   . Fibroid   . Lactose intolerance   . Vitamin D deficiency     Past Surgical History:  Past Surgical History:  Procedure Laterality Date  . ABDOMINAL HYSTERECTOMY  09/24/2013   LSH and bilateral salpingectomy, minilaparotomy & cystoscopy  . CESAREAN SECTION     1993/2002  . CHOLECYSTECTOMY, LAPAROSCOPIC  2004  . COLONOSCOPY  2011   normal  . DILATION AND CURETTAGE OF UTERUS  1988   septic AB/PID/+GC  . OOPHORECTOMY  10/28/2013   left for ovarian torsion  Obstetric History: [id number] Cesarean section x 2  Family History:  Family History  Problem Relation Age of Onset  . Hypertension Father   . Lung cancer Father 42  . Atrial fibrillation Father   . Colon cancer Paternal Grandmother 76  . Diabetes Paternal Grandmother   . Hypertension Paternal Grandmother   . Prostate cancer Paternal Grandfather     Social History:    Allergies:    No Known Allergies  Medications: Prior to Admission medications   Medication Sig Start Date End Date Taking? Authorizing Provider  cholecalciferol (VITAMIN D) 1000 units tablet Take 1,000 Units by mouth daily.   Yes Historical Provider, MD  vitamin C (ASCORBIC ACID) 500 MG tablet Take 500 mg by mouth daily.   Yes Historical Provider, MD  vitamin E (VITAMIN E) 400 UNIT capsule Take 400 Units by mouth daily.   Yes Historical Provider, MD    Physical Exam Vitals: Blood pressure 100/60, pulse 82, height 5\' 1"  (1.549 m), weight 74.4 kg (164 lb).  General: NAD HEENT: normocephalic, anicteric Thyroid: no enlargement, no palpable nodules Pulmonary: No increased work of breathing, CTAB Cardiovascular: RRR without murmur Breast: Breast symmetrical, no tenderness, no palpable nodules or masses, no skin or nipple retraction present, no nipple discharge.  No axillary, infraclavicular, or supraclavicular lymphadenopathy. Abdomen:  soft, non-tender, non-distended.  Umbilicus without lesions.  No hepatomegaly or masses palpable. No evidence of hernia. Surgical scars noted Genitourinary:  External: Normal external female genitalia.  Normal urethral meatus, normal Bartholin's and Skene's glands.    Vagina: Normal vaginal mucosa, no evidence of prolapse.    Cervix: pink, no lesions, stenotic  Uterus: surgically absent  Adnexa:  no adnexal masses, NT  Rectal: deferred  Lymphatic: no evidence of inguinal lymphadenopathy Extremities: no edema, erythema, or tenderness Neurologic: Grossly intact Psychiatric: mood appropriate, affect full      Assessment: 46 y.o. [id number] well woman exam Vasomotor symptoms  Plan:   1) Mammogram - recommend continued yearly screening mammogram and monthly SBE  2) Pap done  3) Can increase vitamin E dose. DId not want further info on alternative treatments for hot flashes  4) Routine healthcare maintenance including cholesterol, diabetes screening discussed  managed by PCP   5) Recommended that she call Dr Elliot's office to see when her next colonoscopy is due.  Dalia Heading, CNM

## 2017-01-07 ENCOUNTER — Encounter: Payer: Self-pay | Admitting: Certified Nurse Midwife

## 2017-01-07 DIAGNOSIS — N951 Menopausal and female climacteric states: Secondary | ICD-10-CM | POA: Insufficient documentation

## 2017-01-09 LAB — IGP,RFX APTIMA HPV ALL PTH: PAP Smear Comment: 0

## 2017-12-10 ENCOUNTER — Encounter: Payer: Self-pay | Admitting: Nurse Practitioner

## 2017-12-20 ENCOUNTER — Ambulatory Visit (INDEPENDENT_AMBULATORY_CARE_PROVIDER_SITE_OTHER): Payer: Managed Care, Other (non HMO) | Admitting: Internal Medicine

## 2017-12-20 ENCOUNTER — Encounter: Payer: Self-pay | Admitting: Internal Medicine

## 2017-12-20 VITALS — BP 120/78 | HR 82 | Resp 16 | Ht 61.0 in | Wt 174.0 lb

## 2017-12-20 DIAGNOSIS — N390 Urinary tract infection, site not specified: Secondary | ICD-10-CM | POA: Diagnosis not present

## 2017-12-20 DIAGNOSIS — Z0001 Encounter for general adult medical examination with abnormal findings: Secondary | ICD-10-CM

## 2017-12-20 DIAGNOSIS — R300 Dysuria: Secondary | ICD-10-CM

## 2017-12-20 DIAGNOSIS — R635 Abnormal weight gain: Secondary | ICD-10-CM

## 2017-12-20 DIAGNOSIS — Z1231 Encounter for screening mammogram for malignant neoplasm of breast: Secondary | ICD-10-CM

## 2017-12-20 DIAGNOSIS — Z1239 Encounter for other screening for malignant neoplasm of breast: Secondary | ICD-10-CM

## 2017-12-20 LAB — POCT URINALYSIS DIPSTICK
Bilirubin, UA: NEGATIVE
Blood, UA: NEGATIVE
Glucose, UA: NEGATIVE
Ketones, UA: NEGATIVE
NITRITE UA: NEGATIVE
PROTEIN UA: NEGATIVE
Spec Grav, UA: <=1.005 — AB (ref 1.010–1.025)
Urobilinogen, UA: 0.2 E.U./dL
pH, UA: 6.5 (ref 5.0–8.0)

## 2017-12-20 MED ORDER — CIPROFLOXACIN HCL 500 MG PO TABS: 500.0000 mg | ORAL_TABLET | Freq: Two times a day (BID) | ORAL | 0 refills | Status: DC

## 2017-12-20 NOTE — Progress Notes (Signed)
[facility] [place], [place] [address]  Internal MEDICINE  Office Visit Note  Patient Name: Jennifer White  [id number]  [id number]  Date of Service: 01/03/2018  Chief Complaint  Patient presents with  . Back Pain    going on for 2 months   . Urinary Tract Infection    HPI Pt is here for routine health maintenance examination. She is concerned about her weight gain. Continues to have back pain, thinks she might have another UTI. Lack of exercise and does not follow any calorie restriction  Current Medication: Outpatient Encounter Medications as of 12/20/2017  Medication Sig  . cholecalciferol (VITAMIN D) 1000 units tablet Take 1,000 Units by mouth daily.  . vitamin C (ASCORBIC ACID) 500 MG tablet Take 500 mg by mouth daily.  . vitamin E (VITAMIN E) 400 UNIT capsule Take 400 Units by mouth daily.  . [DISCONTINUED] ciprofloxacin (CIPRO) 500 MG tablet Take 1 tablet (500 mg total) by mouth 2 (two) times daily.   No facility-administered encounter medications on file as of 12/20/2017.     Surgical History: Past Surgical History:  Procedure Laterality Date  . ABDOMINAL HYSTERECTOMY  09/24/2013   LSH and bilateral salpingectomy, minilaparotomy & cystoscopy  . CESAREAN SECTION     1993/2002  . CHOLECYSTECTOMY, LAPAROSCOPIC  2004  . COLONOSCOPY  2011   normal  . DILATION AND CURETTAGE OF UTERUS  1988   septic AB/PID/+GC  . OOPHORECTOMY  10/28/2013   left for ovarian torsion    Medical History: Past Medical History:  Diagnosis Date  . Anemia   . Endometriosis   . Fibroid   . Lactose intolerance   . Vitamin D deficiency     Family History: Family History  Problem Relation Age of Onset  . Hypertension Father   . Lung cancer Father 80  . Atrial fibrillation Father   . Colon cancer Paternal Grandmother 76  . Diabetes Paternal Grandmother   . Hypertension Paternal Grandmother   . Prostate cancer Paternal Grandfather     Review of  Systems  Constitutional: Negative for chills, diaphoresis and fatigue.  HENT: Negative for ear pain, postnasal drip and sinus pressure.   Eyes: Negative for photophobia, discharge, redness, itching and visual disturbance.  Respiratory: Negative for cough, shortness of breath and wheezing.   Cardiovascular: Negative for chest pain, palpitations and leg swelling.  Gastrointestinal: Negative for abdominal pain, constipation, diarrhea, nausea and vomiting.  Genitourinary: Positive for dysuria. Negative for flank pain.  Musculoskeletal: Positive for back pain. Negative for arthralgias, gait problem and neck pain.  Skin: Negative for color change.  Allergic/Immunologic: Negative for environmental allergies and food allergies.  Neurological: Negative for dizziness and headaches.  Hematological: Does not bruise/bleed easily.  Psychiatric/Behavioral: Negative for agitation, behavioral problems (depression) and hallucinations.   Vital Signs: BP 120/78 (BP Location: Left Arm, Patient Position: Sitting, Cuff Size: Normal)   Pulse 82   Resp 16   Ht 5\' 1"  (1.549 m)   Wt 174 lb (78.9 kg)   SpO2 98%   BMI 32.88 kg/m    Physical Exam  Constitutional: She is oriented to person, place, and time. She appears well-developed and well-nourished. No distress.  HENT:  Head: Normocephalic and atraumatic.  Mouth/Throat: Oropharynx is clear and moist. No oropharyngeal exudate.  Eyes: Pupils are equal, round, and reactive to light. EOM are normal.  Neck: Normal range of motion. Neck supple. No JVD present. No tracheal deviation present. No thyromegaly present.  Cardiovascular: Normal  rate, regular rhythm and normal heart sounds. Exam reveals no gallop and no friction rub.  No murmur heard. Pulmonary/Chest: Effort normal. No respiratory distress. She has no wheezes. She has no rales. She exhibits no tenderness. Right breast exhibits no inverted nipple and no mass. Left breast exhibits no inverted nipple and no  mass. Breasts are symmetrical.  Abdominal: Soft. Bowel sounds are normal.  Musculoskeletal: Normal range of motion.  Lymphadenopathy:    She has no cervical adenopathy.  Neurological: She is alert and oriented to person, place, and time. No cranial nerve deficit.  Skin: Skin is warm and dry. She is not diaphoretic.  Psychiatric: She has a normal mood and affect. Her behavior is normal. Judgment and thought content normal.   LABS: Recent Results (from the past 2160 hour(s))  POCT Urinalysis Dipstick     Status: Abnormal   Collection Time: 12/20/17 11:49 AM  Result Value Ref Range   Color, UA     Clarity, UA     Glucose, UA neg    Bilirubin, UA neg    Ketones, UA neg    Spec Grav, UA <=1.005 (A) 1.010 - 1.025   Blood, UA neg    pH, UA 6.5 5.0 - 8.0   Protein, UA neg    Urobilinogen, UA 0.2 0.2 or 1.0 E.U./dL   Nitrite, UA neg    Leukocytes, UA Small (1+) (A) Negative   Appearance     Odor    CULTURE, URINE COMPREHENSIVE     Status: None   Collection Time: 12/20/17  3:01 PM  Result Value Ref Range   Urine Culture, Comprehensive Final report    Organism ID, Bacteria Comment     Comment: Mixed urogenital flora 10,000-25,000 colony forming units per mL     Assessment/Plan: 1. Encounter for general adult medical examination with abnormal findings - Labs updated   2. Urinary tract infection without hematuria, site unspecified - CULTURE, URINE COMPREHENSIVE  3. Breast screening - MM DIGITAL SCREENING BILATERAL; Future. Pt does have large dense breast   4. Dysuria - POCT Urinalysis Dipstick  5. Weight gain - Calorie restriction and meal prep, with 30 min of walking 3-5 days of the week   General Counseling: Mercadies verbalizes understanding of the findings of todays visit and agrees with plan of treatment. I have discussed any further diagnostic evaluation that may be needed or ordered today. We also reviewed her medications today. she has been encouraged to call the office  with any questions or concerns that should arise related to todays visit. Obesity Counseling: Risk Assessment: An assessment of behavioral risk factors was made today and includes lack of exercise sedentary lifestyle, lack of portion control and poor dietary habits.  Risk Modification Advice: She was counseled on portion control guidelines. Restricting daily caloric intake to. . The detrimental long term effects of obesity on her health and ongoing poor compliance was also discussed with the patient.   Orders Placed This Encounter  Procedures  . CULTURE, URINE COMPREHENSIVE  . MM DIGITAL SCREENING BILATERAL  . POCT Urinalysis Dipstick     Meds ordered this encounter  Medications  . DISCONTD: ciprofloxacin (CIPRO) 500 MG tablet    Sig: Take 1 tablet (500 mg total) by mouth 2 (two) times daily.    Dispense:  14 tablet    Refill:  0    Time spent:25 [place], MD  Internal Medicine

## 2017-12-23 LAB — CULTURE, URINE COMPREHENSIVE

## 2017-12-24 ENCOUNTER — Other Ambulatory Visit: Payer: Self-pay

## 2017-12-24 ENCOUNTER — Telehealth: Payer: Self-pay

## 2017-12-24 MED ORDER — NITROFURANTOIN MONOHYD MACRO 100 MG PO CAPS: ORAL_CAPSULE | ORAL | 0 refills | Status: DC

## 2017-12-24 NOTE — Telephone Encounter (Signed)
Pt called that she is not comfortable taking cipro with side effects so as per dr Humphrey Rolls send macrobid  Fo[place] 7 days

## 2018-01-29 ENCOUNTER — Ambulatory Visit: Payer: 59 | Admitting: Certified Nurse Midwife

## 2018-01-31 ENCOUNTER — Ambulatory Visit
Admission: RE | Admit: 2018-01-31 | Discharge: 2018-01-31 | Disposition: A | Discharge status: Home / Self Care | Payer: Managed Care, Other (non HMO) | Source: Ambulatory Visit | Attending: Internal Medicine | Admitting: Internal Medicine

## 2018-01-31 DIAGNOSIS — Z1231 Encounter for screening mammogram for malignant neoplasm of breast: Secondary | ICD-10-CM | POA: Insufficient documentation

## 2018-01-31 DIAGNOSIS — Z1239 Encounter for other screening for malignant neoplasm of breast: Secondary | ICD-10-CM

## 2018-02-04 ENCOUNTER — Ambulatory Visit (INDEPENDENT_AMBULATORY_CARE_PROVIDER_SITE_OTHER): Payer: Managed Care, Other (non HMO) | Admitting: Certified Nurse Midwife

## 2018-02-04 ENCOUNTER — Encounter: Payer: Self-pay | Admitting: Certified Nurse Midwife

## 2018-02-04 VITALS — BP 120/80 | HR 78 | Ht 61.0 in | Wt 172.0 lb

## 2018-02-04 DIAGNOSIS — Z124 Encounter for screening for malignant neoplasm of cervix: Secondary | ICD-10-CM

## 2018-02-04 DIAGNOSIS — Z01419 Encounter for gynecological examination (general) (routine) without abnormal findings: Secondary | ICD-10-CM | POA: Diagnosis not present

## 2018-02-04 NOTE — Progress Notes (Signed)
Gynecology Annual Exam  PCP: Ronnell Freshwater, NP  Chief Complaint:  Chief Complaint  Patient presents with  . Gynecologic Exam    History of Present Illness:  Jennifer White is a 47 year old African American/Black female, [place], who presents for her annual exam. She is not having any significant gyn problems. She continues to have hot flashes and  has been taking vitamin E for hot flashes, but has been out for a little while.  Her menses are absent due to a laprascopic supracervical hysterectomy for fibroids/menorrhagia in 2014.  She has had no spotting.   The patient's past medical history is remarkable for endometriosis, adenomyosis and fibroids. . She had a left oophorectomy in 2015 for an ovarian torsion, but still has her right ovary. Since her last annual GYN exam dated 01/05/2017, she reports having some problems with left flank pain for months. The discomfort is constant but worsens intermittently and she rates the pain at a 4-5 out of 10 at its worst. Pushing on the area and turning to the right at the waist intensifies the discomfort. She denies hematuria, dysuria, or urinary frequency. She also reports that Tylenol does not eliminate the pain. She does tend to have constipation and occasionally will she blood in the toilet and on the toilet paper if she has to strain to have a hard stool. Has a history of hemorrhoids. She was also evaluated by her PCP for some wheezing and had a negative CXR last month..  She is sexually active.  Her most recent pap smear was obtained 01/05/2017 and was negative.  Her most recent mammogram obtained on 01/31/2018 was normal.  There is no family history of breast cancer.  There is no family history of ovarian cancer.  The patient does  do monthly self breast exams.  She had a colonoscopy in 2011 which showed polyps. She is unsure when she is to have a repeat colonoscopy. The patient had a DEXA scan at [facility] which showed  osteopenia. The patient does not smoke.  The patient does not drink alcohol.  The patient does not use illegal drugs.  The patient exercises occasionally and tries to walk 15 min twice a day.  Current BMI=32.55 kg/m2. She is completing her associate degree at [employer]  then will start on her bachelor's degree.  The patient may get adequate calcium in her diet and with taking Tums and she takes a vitamin D3 supplement. She had a recent cholesterol screen in 2018 and results are pending.    Review of Systems: Review of Systems  Constitutional: Negative for chills, fever and weight loss.       Positive weight gain of 8#  HENT: Negative for congestion, sinus pain and sore throat.   Eyes: Negative for blurred vision and pain.  Respiratory: Positive for wheezing. Negative for hemoptysis and shortness of breath.   Cardiovascular: Negative for chest pain, palpitations and leg swelling.  Gastrointestinal: Positive for blood in stool (with hard stools) and constipation. Negative for abdominal pain, diarrhea, heartburn, nausea and vomiting.  Genitourinary: Positive for flank pain (left). Negative for dysuria, frequency, hematuria and urgency.  Musculoskeletal: Negative for back pain, joint pain and myalgias.  Skin: Negative for itching and rash.  Neurological: Negative for dizziness, tingling and headaches.  Endo/Heme/Allergies: Negative for environmental allergies and polydipsia. Does not bruise/bleed easily.       Negative for hirsutism. Positive for hot flashes   Psychiatric/Behavioral: Negative for depression. The  patient is not nervous/anxious and does not have insomnia.     Past Medical History:  Past Medical History:  Diagnosis Date  . Anemia   . Endometriosis   . Fibroid   . Lactose intolerance   . Vitamin D deficiency     Past Surgical History:  Past Surgical History:  Procedure Laterality Date  . ABDOMINAL HYSTERECTOMY  09/24/2013   LSH and bilateral salpingectomy, minilaparotomy &  cystoscopy  . CESAREAN SECTION     1993/2002  . CHOLECYSTECTOMY, LAPAROSCOPIC  2004  . COLONOSCOPY  2011   normal  . DILATION AND CURETTAGE OF UTERUS  1988   septic AB/PID/+GC  . OOPHORECTOMY  10/28/2013   left for ovarian torsion     Obstetric History: [id number] Cesarean section x 2  Family History:  Family History  Problem Relation Age of Onset  . Hypertension Father   . Lung cancer Father 48  . Atrial fibrillation Father   . Colon cancer Paternal Grandmother 76  . Diabetes Paternal Grandmother   . Hypertension Paternal Grandmother   . Prostate cancer Paternal Grandfather   . Breast cancer Neg Hx     Social History:    Allergies:  No Known Allergies  Medications: Prior to Admission medications   Medication Sig Start Date End Date Taking? Authorizing Provider  cholecalciferol (VITAMIN D) 1000 units tablet Take 1,000 Units by mouth daily.   Yes Historical Provider, MD  vitamin C (ASCORBIC ACID) 500 MG tablet Take 500 mg by mouth daily.   Yes Historical Provider, MD  vitamin E (VITAMIN E) 400 UNIT capsule Take 400 Units by mouth daily.   Yes Historical Provider, MD    Physical Exam Vitals: BP 120/80   Pulse 78   Ht 5\' 1"  (1.549 m)   Wt 172 lb (78 kg)   BMI 32.50 kg/m   General: BF in NAD HEENT: normocephalic, anicteric Thyroid: no enlargement, no palpable nodules Pulmonary: No increased work of breathing, CTAB Back: No CVAT. Tenderness over the lower two ribs on the left flank Cardiovascular: RRR without murmur Breast: Breast symmetrical, no tenderness, no palpable nodules or masses, no skin or nipple retraction present, no nipple discharge.  No axillary, infraclavicular, or supraclavicular lymphadenopathy. Abdomen:  soft, non-tender, non-distended.  Umbilicus without lesions.  No hepatomegaly or masses palpable. No evidence of hernia. Surgical scars noted Genitourinary:  External: Normal external female genitalia.  Normal urethral meatus, normal Bartholin's  and Skene's glands.    Vagina: Normal vaginal mucosa, no evidence of prolapse.    Cervix: pink, no lesions, stenotic  Uterus: surgically absent  Adnexa:  no adnexal masses, NT  Rectal: deferred  Lymphatic: no evidence of inguinal lymphadenopathy Extremities: no edema, erythema, or tenderness Neurologic: Grossly intact Psychiatric: mood appropriate, affect full      Assessment: 47 y.o. [id number] well woman exam Left flank/ thoracic pain  Plan:   1) Mammogram - recommend continued yearly screening mammogram and monthly SBE  2) Pap done  3) Routine healthcare maintenance including cholesterol, diabetes screening discussed managed by PCP   4) Recommend colonoscopy next year at age 9  5) Recommend seeing PCP for further evaluation of left flank pain.  Dalia Heading, CNM

## 2018-02-06 ENCOUNTER — Encounter: Payer: Self-pay | Admitting: Certified Nurse Midwife

## 2018-02-07 LAB — IGP, APTIMA HPV
HPV Aptima: NEGATIVE
PAP SMEAR COMMENT: 0

## 2018-02-28 ENCOUNTER — Encounter: Payer: Self-pay | Admitting: Nurse Practitioner

## 2018-02-28 ENCOUNTER — Ambulatory Visit (INDEPENDENT_AMBULATORY_CARE_PROVIDER_SITE_OTHER): Payer: Managed Care, Other (non HMO) | Admitting: Nurse Practitioner

## 2018-02-28 VITALS — BP 117/79 | HR 90 | Temp 96.7°F | Resp 16 | Ht 61.0 in | Wt 187.0 lb

## 2018-02-28 DIAGNOSIS — N390 Urinary tract infection, site not specified: Secondary | ICD-10-CM

## 2018-02-28 DIAGNOSIS — R10812 Left upper quadrant abdominal tenderness: Secondary | ICD-10-CM | POA: Diagnosis not present

## 2018-02-28 DIAGNOSIS — R300 Dysuria: Secondary | ICD-10-CM

## 2018-02-28 LAB — POCT URINALYSIS DIPSTICK
BILIRUBIN UA: NEGATIVE
Blood, UA: NEGATIVE
Glucose, UA: NEGATIVE
NITRITE UA: NEGATIVE
PH UA: 6 (ref 5.0–8.0)
PROTEIN UA: NEGATIVE
SPEC GRAV UA: 1.025 (ref 1.010–1.025)
UROBILINOGEN UA: 0.2 U/dL

## 2018-02-28 MED ORDER — AMOXICILLIN-POT CLAVULANATE 875-125 MG PO TABS: 1.0000 | ORAL_TABLET | Freq: Two times a day (BID) | ORAL | 0 refills | Status: DC

## 2018-02-28 NOTE — Progress Notes (Signed)
[facility] [place], [place] [address]  Internal MEDICINE  Office Visit Note  Patient Name: Jennifer White  [id number]  [id number]  Date of Service: 03/04/2018   Pt is here for a sick visit.  Chief Complaint  Patient presents with  . Back Pain    going on for 9 months     The patient states that she has near constant pain in left flank area. Pain is just under her rib cage. Pain can be worse if she turns a certain way. Also hurts if she pushes on the area. When she continuously pushes on it, it will cause her to belch. The belching does not relieve the discomfort. Denies nausea or vomiting. Does have chronic constipation. No worse over the last several months. The pain is aggravating. Doesn't make her miss work or change her normal routines. Just always is present. She was treated for UTI at her last visit. Took macrobid for 7 days. This did seem to help a little, but discomfort returned as soon as she finished the antibiotic.        Current Medication:  Outpatient Encounter Medications as of 02/28/2018  Medication Sig  . amoxicillin-clavulanate (AUGMENTIN) 875-125 MG tablet Take 1 tablet by mouth 2 (two) times daily.  . cholecalciferol (VITAMIN D) 1000 units tablet Take 1,000 Units by mouth daily.  . vitamin C (ASCORBIC ACID) 500 MG tablet Take 500 mg by mouth daily.  . vitamin E (VITAMIN E) 400 UNIT capsule Take 400 Units by mouth daily.   No facility-administered encounter medications on file as of 02/28/2018.       Medical History: Past Medical History:  Diagnosis Date  . Anemia   . Endometriosis   . Fibroid   . Lactose intolerance   . Vitamin D deficiency      Review of Systems  Constitutional: Negative for activity change, chills, fatigue and unexpected weight change.  HENT: Negative for congestion, postnasal drip, rhinorrhea, sneezing and sore throat.   Eyes: Negative.  Negative for redness.  Respiratory: Negative for cough,  chest tightness, shortness of breath and wheezing.   Cardiovascular: Negative for chest pain and palpitations.  Gastrointestinal: Positive for abdominal pain. Negative for constipation, diarrhea, nausea and vomiting.  Endocrine: Negative for cold intolerance, heat intolerance, polyphagia and polyuria.  Genitourinary: Positive for flank pain. Negative for dysuria and frequency.  Musculoskeletal: Positive for back pain. Negative for arthralgias, joint swelling and neck pain.  Skin: Negative for rash.  Neurological: Negative.  Negative for tremors and numbness.  Hematological: Negative for adenopathy. Does not bruise/bleed easily.  Psychiatric/Behavioral: Negative for behavioral problems (Depression), sleep disturbance and suicidal ideas. The patient is not nervous/anxious.    Today's Vitals   02/28/18 1551  BP: 117/79  Pulse: 90  Resp: 16  Temp: (!) 96.7 F (35.9 C)  SpO2: 98%  Weight: 187 lb (84.8 kg)  Height: 5\' 1"  (1.549 m)   Physical Exam  Constitutional: She is oriented to person, place, and time. She appears well-developed and well-nourished. No distress.  HENT:  Head: Normocephalic and atraumatic.  Mouth/Throat: Oropharynx is clear and moist. No oropharyngeal exudate.  Eyes: Pupils are equal, round, and reactive to light. EOM are normal.  Neck: Normal range of motion. Neck supple. No JVD present. No tracheal deviation present. No thyromegaly present.  Cardiovascular: Normal rate, regular rhythm and normal heart sounds. Exam reveals no gallop and no friction rub.  No murmur heard. Pulmonary/Chest: Effort normal and breath sounds normal.  No respiratory distress. She has no wheezes. She has no rales. She exhibits no tenderness.  Abdominal: Soft. Bowel sounds are normal. There is tenderness in the left upper quadrant. There is CVA tenderness. There is no rigidity, no rebound and no guarding.  Genitourinary:  Genitourinary Comments: Urine sample is positive for trace WBC and trace  ketones.   Musculoskeletal: Normal range of motion.  Lymphadenopathy:    She has no cervical adenopathy.  Neurological: She is alert and oriented to person, place, and time. No cranial nerve deficit.  Skin: Skin is warm and dry. She is not diaphoretic.  Psychiatric: She has a normal mood and affect. Her behavior is normal. Judgment and thought content normal.  Nursing note and vitals reviewed.  Assessment/Plan: 1. Left upper quadrant abdominal tenderness without rebound tenderness pai nwith mild/moderate palpation. Will get abdominal ultrasound for further evaluation.  - US Abdomen Complete; Future  2. Urinary tract infection without hematuria, site unspecified Start augmentin 875mg  bid for 10 days. Send urine for culture and sensitivity and adjust antibiotics as indicated.  - amoxicillin-clavulanate (AUGMENTIN) 875-125 MG tablet; Take 1 tablet by mouth 2 (two) times daily.  Dispense: 20 tablet; Refill: 0  3. Dysuria - POCT Urinalysis Dipstick positive for trace WBC and trace ketones. Treat for infection and adjust as indicated.  - CULTURE, URINE COMPREHENSIVE  General Counseling: Denim verbalizes understanding of the findings of todays visit and agrees with plan of treatment. I have discussed any further diagnostic evaluation that may be needed or ordered today. We also reviewed her medications today. she has been encouraged to call the office with any questions or concerns that should arise related to todays visit.    Counseling:  This patient was seen by Leretha Pol, FNP- C in Collaboration with Dr Lavera Guise as a part of collaborative care agreement   Orders Placed This Encounter  Procedures  . CULTURE, URINE COMPREHENSIVE  . US Abdomen Complete  . POCT Urinalysis Dipstick    Meds ordered this encounter  Medications  . amoxicillin-clavulanate (AUGMENTIN) 875-125 MG tablet    Sig: Take 1 tablet by mouth 2 (two) times daily.    Dispense:  20 tablet    Refill:  0     Order Specific Question:   Supervising Provider    Answer:   Lavera Guise [[id number]]    Time spent: 15 Minutes

## 2018-03-02 LAB — CULTURE, URINE COMPREHENSIVE

## 2018-03-20 DIAGNOSIS — R300 Dysuria: Secondary | ICD-10-CM | POA: Insufficient documentation

## 2018-03-20 DIAGNOSIS — N390 Urinary tract infection, site not specified: Secondary | ICD-10-CM | POA: Insufficient documentation

## 2018-03-20 DIAGNOSIS — R10812 Left upper quadrant abdominal tenderness: Secondary | ICD-10-CM | POA: Insufficient documentation

## 2018-03-22 ENCOUNTER — Ambulatory Visit: Payer: Managed Care, Other (non HMO)

## 2018-03-22 DIAGNOSIS — R10812 Left upper quadrant abdominal tenderness: Secondary | ICD-10-CM

## 2018-04-11 ENCOUNTER — Ambulatory Visit: Payer: Managed Care, Other (non HMO) | Admitting: Nurse Practitioner

## 2018-04-11 ENCOUNTER — Encounter: Payer: Self-pay | Admitting: Nurse Practitioner

## 2018-04-11 VITALS — BP 108/73 | HR 99 | Resp 16 | Ht 61.0 in | Wt 170.0 lb

## 2018-04-11 DIAGNOSIS — R300 Dysuria: Secondary | ICD-10-CM | POA: Diagnosis not present

## 2018-04-11 DIAGNOSIS — N390 Urinary tract infection, site not specified: Secondary | ICD-10-CM | POA: Diagnosis not present

## 2018-04-11 DIAGNOSIS — R10812 Left upper quadrant abdominal tenderness: Secondary | ICD-10-CM

## 2018-04-11 LAB — POCT URINALYSIS DIPSTICK
BILIRUBIN UA: NEGATIVE
Glucose, UA: NEGATIVE
Nitrite, UA: NEGATIVE
PH UA: 5 (ref 5.0–8.0)
Protein, UA: POSITIVE — AB
RBC UA: NEGATIVE
Spec Grav, UA: >=1.03 — AB (ref 1.010–1.025)
UROBILINOGEN UA: 0.2 U/dL

## 2018-04-11 MED ORDER — NITROFURANTOIN MONOHYD MACRO 100 MG PO CAPS: 100.0000 mg | ORAL_CAPSULE | Freq: Two times a day (BID) | ORAL | 0 refills | Status: DC

## 2018-04-11 NOTE — Progress Notes (Signed)
[facility] [place], [place] [address]  Internal MEDICINE  Office Visit Note  Patient Name: Jennifer White  [id number]  [id number]  Date of Service: 05/04/2018  Chief Complaint  Patient presents with  . other    6wk follow up, review labs  . Urinary Tract Infection    urinalysis repeat    The patient states that she has near constant pain in left flank area. Pain is just under her rib cage. Pain can be worse if she turns a certain way. Also hurts if she pushes on the area. When she continuously pushes on it, it will cause her to belch. The belching does not relieve the discomfort. Denies nausea or vomiting. Does have chronic constipation. No worse over the last several months. The pain is aggravating. Doesn't make her miss work or change her normal routines. Just always is present. She was treated for UTI at two most recent visits.Took macrobid for 7 days. This did seem to help a little, but discomfort returned as soon as she finished the antibiotic. She did have ultrasound of her abdomen since her last visit. Results were unremarkable.       Current Medication: Outpatient Encounter Medications as of 04/11/2018  Medication Sig  . cholecalciferol (VITAMIN D) 1000 units tablet Take 1,000 Units by mouth daily.  . vitamin C (ASCORBIC ACID) 500 MG tablet Take 500 mg by mouth daily.  . vitamin E (VITAMIN E) 400 UNIT capsule Take 400 Units by mouth daily.  . nitrofurantoin, macrocrystal-monohydrate, (MACROBID) 100 MG capsule Take 1 capsule (100 mg total) by mouth 2 (two) times daily.  . [DISCONTINUED] amoxicillin-clavulanate (AUGMENTIN) 875-125 MG tablet Take 1 tablet by mouth 2 (two) times daily. (Patient not taking: Reported on 04/11/2018)   No facility-administered encounter medications on file as of 04/11/2018.     Surgical History: Past Surgical History:  Procedure Laterality Date  . CESAREAN SECTION     1993/2002  . CHOLECYSTECTOMY, LAPAROSCOPIC  2004   . COLONOSCOPY  2011   normal  . DILATION AND CURETTAGE OF UTERUS  1988   septic AB/PID/+GC  . LAPAROSCOPIC SUPRACERVICAL HYSTERECTOMY  09/24/2013   and bilateral salpingectomy, minilaparotomy and cystoscopy  . OOPHORECTOMY  10/28/2013   left for ovarian torsion    Medical History: Past Medical History:  Diagnosis Date  . Anemia   . Endometriosis   . Fibroid   . Lactose intolerance   . Vitamin D deficiency     Family History: Family History  Problem Relation Age of Onset  . Hypertension Father   . Lung cancer Father 12  . Atrial fibrillation Father   . Colon cancer Paternal Grandmother 83       had a blockage but unsure if colon cancer or stomach cancer  . Diabetes Paternal Grandmother        amputee  . Hypertension Paternal Grandmother   . Prostate cancer Paternal Grandfather   . Breast cancer Neg Hx     Social History   Socioeconomic History  . Marital status: Married    Spouse name: Not on file  . Number of children: 2  . Years of education: Not on file  . Highest education level: Not on file  Occupational History  . Occupation: Customer service manager  Social Needs  . Financial resource strain: Not on file  . Food insecurity:    Worry: Not on file    Inability: Not on file  . Transportation needs:    Medical:  Not on file    Non-medical: Not on file  Tobacco Use  . Smoking status: Never Smoker  . Smokeless tobacco: Never Used  Substance and Sexual Activity  . Alcohol use: No  . Drug use: No  . Sexual activity: Yes    Partners: Male    Birth control/protection: Surgical  Lifestyle  . Physical activity:    Days per week: 5 days    Minutes per session: 30 min  . Stress: Not at all  Relationships  . Social connections:    Talks on phone: Not on file    Gets together: Not on file    Attends religious service: Not on file    Active member of club or organization: Not on file    Attends meetings of clubs or organizations: Not on file     Relationship status: Not on file  . Intimate partner violence:    Fear of current or ex partner: Not on file    Emotionally abused: Not on file    Physically abused: Not on file    Forced sexual activity: Not on file  Other Topics Concern  . Not on file  Social History Narrative  . Not on file      Review of Systems  Constitutional: Negative for activity change, chills, fatigue and unexpected weight change.  HENT: Negative for congestion, postnasal drip, rhinorrhea, sneezing and sore throat.   Eyes: Negative.  Negative for redness.  Respiratory: Negative for cough, chest tightness, shortness of breath and wheezing.   Cardiovascular: Negative for chest pain and palpitations.  Gastrointestinal: Positive for abdominal pain. Negative for constipation, diarrhea, nausea and vomiting.  Endocrine: Negative for cold intolerance, heat intolerance, polyphagia and polyuria.  Genitourinary: Positive for flank pain. Negative for dysuria and frequency.  Musculoskeletal: Positive for back pain. Negative for arthralgias, joint swelling and neck pain.  Skin: Negative for rash.  Neurological: Negative.  Negative for tremors and numbness.  Hematological: Negative for adenopathy. Does not bruise/bleed easily.  Psychiatric/Behavioral: Negative for behavioral problems (Depression), sleep disturbance and suicidal ideas. The patient is not nervous/anxious.     Today's Vitals   04/11/18 1608  BP: 108/73  Pulse: 99  Resp: 16  SpO2: 98%  Weight: 170 lb (77.1 kg)  Height: 5\' 1"  (1.549 m)    Physical Exam  Constitutional: She is oriented to person, place, and time. She appears well-developed and well-nourished. No distress.  HENT:  Head: Normocephalic and atraumatic.  Nose: Nose normal.  Mouth/Throat: Oropharynx is clear and moist. No oropharyngeal exudate.  Eyes: Pupils are equal, round, and reactive to light. EOM are normal.  Neck: Normal range of motion. Neck supple. No JVD present. No tracheal  deviation present. No thyromegaly present.  Cardiovascular: Normal rate, regular rhythm and normal heart sounds. Exam reveals no gallop and no friction rub.  No murmur heard. Pulmonary/Chest: Effort normal and breath sounds normal. No respiratory distress. She has no wheezes. She has no rales. She exhibits no tenderness.  Abdominal: Soft. Bowel sounds are normal. There is tenderness in the left upper quadrant. There is CVA tenderness. There is no rigidity, no rebound and no guarding.  Genitourinary:  Genitourinary Comments: Urine sample is positive for trace WBC and trace ketones.   Musculoskeletal: Normal range of motion.  Lymphadenopathy:    She has no cervical adenopathy.  Neurological: She is alert and oriented to person, place, and time. No cranial nerve deficit.  Skin: Skin is warm and dry. She is not diaphoretic.  Psychiatric: She has a normal mood and affect. Her behavior is normal. Judgment and thought content normal.  Nursing note and vitals reviewed.  Assessment/Plan: 1. Urinary tract infection without hematuria, site unspecified U/a still positive for trace WBC and small protein. Will do fie additional days macrobid. Adjust antibiotics based on results of culture and sensitive.  - nitrofurantoin, macrocrystal-monohydrate, (MACROBID) 100 MG capsule; Take 1 capsule (100 mg total) by mouth 2 (two) times daily.  Dispense: 10 capsule; Refill: 0  2. Dysuria - POCT Urinalysis Dipstick - CULTURE, URINE COMPREHENSIVE  3. Left upper quadrant abdominal tenderness without rebound tenderness Reviewed ultrasound of abdomen. Results unremarkable. Will monitor closely. Pain is improving.   General Counseling: Matika verbalizes understanding of the findings of todays visit and agrees with plan of treatment. I have discussed any further diagnostic evaluation that may be needed or ordered today. We also reviewed her medications today. she has been encouraged to call the office with any questions  or concerns that should arise related to todays visit.  This patient was seen by Leretha Pol FNP Collaboration with Dr Lavera Guise as a part of collaborative care agreement  Orders Placed This Encounter  Procedures  . CULTURE, URINE COMPREHENSIVE  . POCT Urinalysis Dipstick    Meds ordered this encounter  Medications  . nitrofurantoin, macrocrystal-monohydrate, (MACROBID) 100 MG capsule    Sig: Take 1 capsule (100 mg total) by mouth 2 (two) times daily.    Dispense:  10 capsule    Refill:  0    Order Specific Question:   Supervising Provider    Answer:   Lavera Guise [[id number]]    Time spent: 62 Minutes      Dr Lavera Guise Internal medicine

## 2018-04-14 LAB — CULTURE, URINE COMPREHENSIVE

## 2018-06-26 ENCOUNTER — Encounter: Payer: Self-pay | Admitting: Nurse Practitioner

## 2018-06-26 ENCOUNTER — Ambulatory Visit: Payer: Self-pay | Admitting: Internal Medicine

## 2018-06-26 ENCOUNTER — Ambulatory Visit: Payer: Managed Care, Other (non HMO) | Admitting: Nurse Practitioner

## 2018-06-26 VITALS — BP 117/68 | HR 78 | Resp 16 | Ht 61.0 in | Wt 171.0 lb

## 2018-06-26 DIAGNOSIS — Z6832 Body mass index (BMI) 32.0-32.9, adult: Secondary | ICD-10-CM

## 2018-06-26 DIAGNOSIS — J45909 Unspecified asthma, uncomplicated: Secondary | ICD-10-CM

## 2018-06-26 MED ORDER — ALBUTEROL SULFATE HFA 108 (90 BASE) MCG/ACT IN AERS: 1.0000 | INHALATION_SPRAY | Freq: Four times a day (QID) | RESPIRATORY_TRACT | 3 refills | Status: DC | PRN

## 2018-06-26 NOTE — Progress Notes (Signed)
[facility] [place], [place] [address]  Internal MEDICINE  Office Visit Note  Patient Name: Jennifer White  [id number]  [id number]  Date of Service: 07/02/2018  Chief Complaint  Patient presents with  . Medical Management of Chronic Issues    [facility] paperwork, appeal , mumps vaccination    The patient has had a one pound weight gain since her last visit. Her BMI is over 32, which, according to her employer, is too much. Her insurance would like to raise her insurance premiums due to her BMI. She has brought a waiver form with her to state that she will start in medically managed weight loss program and prevent her premiums from going up.       Current Medication: Outpatient Encounter Medications as of 06/26/2018  Medication Sig  . cholecalciferol (VITAMIN D) 1000 units tablet Take 1,000 Units by mouth daily.  . vitamin C (ASCORBIC ACID) 500 MG tablet Take 500 mg by mouth daily.  . vitamin E (VITAMIN E) 400 UNIT capsule Take 400 Units by mouth daily.  Marland Kitchen albuterol (VENTOLIN HFA) 108 (90 Base) MCG/ACT inhaler Inhale 1 puff into the lungs every 6 (six) hours as needed for wheezing or shortness of breath.  . [DISCONTINUED] nitrofurantoin, macrocrystal-monohydrate, (MACROBID) 100 MG capsule Take 1 capsule (100 mg total) by mouth 2 (two) times daily. (Patient not taking: Reported on 06/26/2018)   No facility-administered encounter medications on file as of 06/26/2018.     Surgical History: Past Surgical History:  Procedure Laterality Date  . CESAREAN SECTION     1993/2002  . CHOLECYSTECTOMY, LAPAROSCOPIC  2004  . COLONOSCOPY  2011   normal  . DILATION AND CURETTAGE OF UTERUS  1988   septic AB/PID/+GC  . LAPAROSCOPIC SUPRACERVICAL HYSTERECTOMY  09/24/2013   and bilateral salpingectomy, minilaparotomy and cystoscopy  . OOPHORECTOMY  10/28/2013   left for ovarian torsion    Medical History: Past Medical History:  Diagnosis Date  . Anemia   .  Endometriosis   . Fibroid   . Lactose intolerance   . Vitamin D deficiency     Family History: Family History  Problem Relation Age of Onset  . Hypertension Father   . Lung cancer Father 62  . Atrial fibrillation Father   . Colon cancer Paternal Grandmother 51       had a blockage but unsure if colon cancer or stomach cancer  . Diabetes Paternal Grandmother        amputee  . Hypertension Paternal Grandmother   . Prostate cancer Paternal Grandfather   . Breast cancer Neg Hx     Social History   Socioeconomic History  . Marital status: Married    Spouse name: Not on file  . Number of children: 2  . Years of education: Not on file  . Highest education level: Not on file  Occupational History  . Occupation: Customer service manager  Social Needs  . Financial resource strain: Not on file  . Food insecurity:    Worry: Not on file    Inability: Not on file  . Transportation needs:    Medical: Not on file    Non-medical: Not on file  Tobacco Use  . Smoking status: Never Smoker  . Smokeless tobacco: Never Used  Substance and Sexual Activity  . Alcohol use: No  . Drug use: No  . Sexual activity: Yes    Partners: Male    Birth control/protection: Surgical  Lifestyle  . Physical activity:  Days per week: 5 days    Minutes per session: 30 min  . Stress: Not at all  Relationships  . Social connections:    Talks on phone: Not on file    Gets together: Not on file    Attends religious service: Not on file    Active member of club or organization: Not on file    Attends meetings of clubs or organizations: Not on file    Relationship status: Not on file  . Intimate partner violence:    Fear of current or ex partner: Not on file    Emotionally abused: Not on file    Physically abused: Not on file    Forced sexual activity: Not on file  Other Topics Concern  . Not on file  Social History Narrative  . Not on file      Review of Systems  Constitutional: Negative  for activity change, chills, fatigue and unexpected weight change.  HENT: Negative for congestion, postnasal drip, rhinorrhea, sneezing and sore throat.   Eyes: Negative.  Negative for redness.  Respiratory: Negative for cough, chest tightness, shortness of breath and wheezing.   Cardiovascular: Negative for chest pain and palpitations.  Gastrointestinal: Negative for abdominal pain, constipation, diarrhea, nausea and vomiting.  Endocrine: Negative for cold intolerance, heat intolerance, polydipsia, polyphagia and polyuria.  Genitourinary: Negative for dysuria, flank pain and frequency.  Musculoskeletal: Negative for arthralgias, back pain, joint swelling and neck pain.  Skin: Negative for rash.  Allergic/Immunologic: Positive for environmental allergies.  Neurological: Negative for dizziness, tremors, numbness and headaches.  Hematological: Negative for adenopathy. Does not bruise/bleed easily.  Psychiatric/Behavioral: Negative for behavioral problems (Depression), sleep disturbance and suicidal ideas. The patient is not nervous/anxious.     Today's Vitals   06/26/18 1545  BP: 117/68  Pulse: 78  Resp: 16  SpO2: 95%  Weight: 171 lb (77.6 kg)  Height: 5\' 1"  (1.549 m)    Physical Exam  Constitutional: She is oriented to person, place, and time. She appears well-developed and well-nourished. No distress.  HENT:  Head: Normocephalic and atraumatic.  Nose: Nose normal.  Mouth/Throat: Oropharynx is clear and moist. No oropharyngeal exudate.  Eyes: Pupils are equal, round, and reactive to light. EOM are normal.  Neck: Normal range of motion. Neck supple. No JVD present. No tracheal deviation present. No thyromegaly present.  Cardiovascular: Normal rate, regular rhythm and normal heart sounds. Exam reveals no gallop and no friction rub.  No murmur heard. Pulmonary/Chest: Effort normal and breath sounds normal. No respiratory distress. She has no wheezes. She has no rales. She exhibits no  tenderness.  Abdominal: Soft. Bowel sounds are normal. There is no tenderness. There is CVA tenderness. There is no rigidity, no rebound and no guarding.  Musculoskeletal: Normal range of motion.  Lymphadenopathy:    She has no cervical adenopathy.  Neurological: She is alert and oriented to person, place, and time. No cranial nerve deficit.  Skin: Skin is warm and dry. Capillary refill takes less than 2 seconds. She is not diaphoretic.  Psychiatric: She has a normal mood and affect. Her behavior is normal. Judgment and thought content normal.  Nursing note and vitals reviewed.  Assessment/Plan: 1. Mild asthma without complication, unspecified whether persistent May use ventolin inhaler as needed and as prescribed. New prescription provided today. - albuterol (VENTOLIN HFA) 108 (90 Base) MCG/ACT inhaler; Inhale 1 puff into the lungs every 6 (six) hours as needed for wheezing or shortness of breath.  Dispense: 1 Inhaler;  Refill: 3  2. BMI 32.0-32.9,adult Waiver form filled out and returned to the patient, stating that she would participate in medically managed program for weight loss. Written information regarding weight management provided today. Recommended she limit calorie intake to 1200-1500 calories per day, and recommended she participate in low-impact, low-intensity cardiovascular exercise in order to help with weight loss. Also encouraged her to join weight watchers.   General Counseling: Jennifer White verbalizes understanding of the findings of todays visit and agrees with plan of treatment. I have discussed any further diagnostic evaluation that may be needed or ordered today. We also reviewed her medications today. she has been encouraged to call the office with any questions or concerns that should arise related to todays visit.   There is a liability release in patients' chart. There has been a 10 minute discussion about the side effects including but not limited to elevated blood  pressure, anxiety, lack of sleep and dry mouth. Pt understands and will like to start/continue on appetite suppressant at this time. There will be one month RX given at the time of visit with proper follow up. Nova diet plan with restricted calories is given to the pt. Pt understands and agrees with  plan of treatment  This patient was seen by Leretha Pol FNP Collaboration with Dr Lavera Guise as a part of collaborative care agreement   Meds ordered this encounter  Medications  . albuterol (VENTOLIN HFA) 108 (90 Base) MCG/ACT inhaler    Sig: Inhale 1 puff into the lungs every 6 (six) hours as needed for wheezing or shortness of breath.    Dispense:  1 Inhaler    Refill:  3    Order Specific Question:   Supervising Provider    Answer:   Lavera Guise [[id number]]    Time spent: 63 Minutes      Dr Lavera Guise Internal medicine

## 2018-07-02 DIAGNOSIS — Z6832 Body mass index (BMI) 32.0-32.9, adult: Secondary | ICD-10-CM | POA: Insufficient documentation

## 2018-07-02 DIAGNOSIS — J45909 Unspecified asthma, uncomplicated: Secondary | ICD-10-CM | POA: Insufficient documentation

## 2018-07-12 ENCOUNTER — Ambulatory Visit: Payer: Self-pay | Admitting: Nurse Practitioner

## 2018-07-12 ENCOUNTER — Other Ambulatory Visit: Payer: Self-pay | Admitting: Nurse Practitioner

## 2018-07-12 ENCOUNTER — Encounter: Payer: Self-pay | Admitting: Nurse Practitioner

## 2018-07-13 LAB — COMPREHENSIVE METABOLIC PANEL
A/G RATIO: 1.7 (ref 1.2–2.2)
ALBUMIN: 4.3 g/dL (ref 3.5–5.5)
ALT: 32 IU/L (ref 0–32)
AST: 24 IU/L (ref 0–40)
Alkaline Phosphatase: 92 IU/L (ref 39–117)
BILIRUBIN TOTAL: 0.6 mg/dL (ref 0.0–1.2)
BUN / CREAT RATIO: 16 (ref 9–23)
BUN: 14 mg/dL (ref 6–24)
CALCIUM: 9.5 mg/dL (ref 8.7–10.2)
CHLORIDE: 104 mmol/L (ref 96–106)
CO2: 24 mmol/L (ref 20–29)
Creatinine, Ser: 0.86 mg/dL (ref 0.57–1.00)
GFR, EST AFRICAN AMERICAN: 92 mL/min/{1.73_m2} (ref >59)
GFR, EST NON AFRICAN AMERICAN: 80 mL/min/{1.73_m2} (ref >59)
Globulin, Total: 2.5 g/dL (ref 1.5–4.5)
Glucose: 83 mg/dL (ref 65–99)
Potassium: 4.1 mmol/L (ref 3.5–5.2)
Sodium: 143 mmol/L (ref 134–144)
Total Protein: 6.8 g/dL (ref 6.0–8.5)

## 2018-07-13 LAB — CBC
HEMATOCRIT: 41.6 % (ref 34.0–46.6)
HEMOGLOBIN: 13.9 g/dL (ref 11.1–15.9)
MCH: 29.4 pg (ref 26.6–33.0)
MCHC: 33.4 g/dL (ref 31.5–35.7)
MCV: 88 fL (ref 79–97)
Platelets: 215 10*3/uL (ref 150–450)
RBC: 4.72 x10E6/uL (ref 3.77–5.28)
RDW: 13 % (ref 12.3–15.4)
WBC: 6.3 10*3/uL (ref 3.4–10.8)

## 2018-07-13 LAB — T3: T3, Total: 88 ng/dL (ref 71–180)

## 2018-07-13 LAB — TSH: TSH: 2.62 u[IU]/mL (ref 0.450–4.500)

## 2018-07-13 LAB — VITAMIN D 25 HYDROXY (VIT D DEFICIENCY, FRACTURES): Vit D, 25-Hydroxy: 34.1 ng/mL (ref 30.0–100.0)

## 2018-07-13 LAB — T4, FREE: Free T4: 1.01 ng/dL (ref 0.82–1.77)

## 2018-07-16 ENCOUNTER — Ambulatory Visit: Payer: Self-pay | Admitting: Internal Medicine

## 2018-10-15 ENCOUNTER — Other Ambulatory Visit (HOSPITAL_COMMUNITY): Payer: Self-pay | Admitting: Physician Assistant

## 2018-10-15 ENCOUNTER — Other Ambulatory Visit: Payer: Self-pay | Admitting: Physician Assistant

## 2018-10-15 DIAGNOSIS — H9202 Otalgia, left ear: Secondary | ICD-10-CM

## 2018-10-25 ENCOUNTER — Ambulatory Visit: Payer: Managed Care, Other (non HMO)

## 2018-12-19 ENCOUNTER — Encounter: Payer: Self-pay | Admitting: Nurse Practitioner

## 2019-01-24 ENCOUNTER — Encounter: Payer: Self-pay | Admitting: Nurse Practitioner

## 2019-01-24 ENCOUNTER — Ambulatory Visit: Payer: Managed Care, Other (non HMO) | Admitting: Nurse Practitioner

## 2019-01-24 ENCOUNTER — Other Ambulatory Visit: Payer: Self-pay

## 2019-01-24 VITALS — BP 114/80 | HR 88 | Resp 16 | Ht 61.0 in | Wt 172.4 lb

## 2019-01-24 DIAGNOSIS — Z0001 Encounter for general adult medical examination with abnormal findings: Secondary | ICD-10-CM | POA: Diagnosis not present

## 2019-01-24 DIAGNOSIS — R252 Cramp and spasm: Secondary | ICD-10-CM

## 2019-01-24 DIAGNOSIS — Z1211 Encounter for screening for malignant neoplasm of colon: Secondary | ICD-10-CM | POA: Diagnosis not present

## 2019-01-24 DIAGNOSIS — J45909 Unspecified asthma, uncomplicated: Secondary | ICD-10-CM | POA: Diagnosis not present

## 2019-01-24 MED ORDER — ALBUTEROL SULFATE HFA 108 (90 BASE) MCG/ACT IN AERS: 1.0000 | INHALATION_SPRAY | Freq: Four times a day (QID) | RESPIRATORY_TRACT | 5 refills | Status: DC | PRN

## 2019-01-24 NOTE — Progress Notes (Signed)
[facility] [place], [place] [address]  Internal MEDICINE  Office Visit Note  Patient Name: Jennifer White  [id number]  [id number]  Date of Service: 02/10/2019   Pt is here for routine health maintenance examination   Chief Complaint  Patient presents with  . Annual Exam    pt have some concerns/question about results on MyChart, some breast tenderness  . Anemia  . Asthma     The patient is here for general health maintenance exam. She is feeling well overall. She had routine, fasting labs done 06/2018 and all were normal. She sees GYN provider to have pap smears done. She is schedled to have screening mammogram 02/12/2019. She needs to have screening colonoscopy.    Current Medication: Outpatient Encounter Medications as of 01/24/2019  Medication Sig  . albuterol (VENTOLIN HFA) 108 (90 Base) MCG/ACT inhaler Inhale 1 puff into the lungs every 6 (six) hours as needed for wheezing or shortness of breath.  . cholecalciferol (VITAMIN D) 1000 units tablet Take 1,000 Units by mouth daily.  . vitamin C (ASCORBIC ACID) 500 MG tablet Take 500 mg by mouth daily.  . vitamin E (VITAMIN E) 400 UNIT capsule Take 400 Units by mouth daily.  . [DISCONTINUED] albuterol (VENTOLIN HFA) 108 (90 Base) MCG/ACT inhaler Inhale 1 puff into the lungs every 6 (six) hours as needed for wheezing or shortness of breath.   No facility-administered encounter medications on file as of 01/24/2019.     Surgical History: Past Surgical History:  Procedure Laterality Date  . CESAREAN SECTION     1993/2002  . CHOLECYSTECTOMY, LAPAROSCOPIC  2004  . COLONOSCOPY  2011   normal  . DILATION AND CURETTAGE OF UTERUS  1988   septic AB/PID/+GC  . LAPAROSCOPIC SUPRACERVICAL HYSTERECTOMY  09/24/2013   and bilateral salpingectomy, minilaparotomy and cystoscopy  . OOPHORECTOMY  10/28/2013   left for ovarian torsion    Medical History: Past Medical History:  Diagnosis Date  . Anemia   .  Asthma   . Endometriosis   . Fibroid   . Lactose intolerance   . Vitamin D deficiency     Family History: Family History  Problem Relation Age of Onset  . Hypertension Father   . Lung cancer Father 63  . Atrial fibrillation Father   . Colon cancer Paternal Grandmother 9       had a blockage but unsure if colon cancer or stomach cancer  . Diabetes Paternal Grandmother        amputee  . Hypertension Paternal Grandmother   . Prostate cancer Paternal Grandfather   . Breast cancer Neg Hx       Review of Systems  Constitutional: Negative for activity change, chills, fatigue and unexpected weight change.  HENT: Negative for congestion, postnasal drip, rhinorrhea, sneezing and sore throat.   Respiratory: Negative for cough, chest tightness, shortness of breath and wheezing.        Intermittent wheezing associated with exposure to allergens and with exercise.   Cardiovascular: Negative for chest pain and palpitations.  Gastrointestinal: Negative for abdominal pain, constipation, diarrhea, nausea and vomiting.  Endocrine: Negative for cold intolerance, heat intolerance, polydipsia and polyuria.  Genitourinary: Negative for dysuria, flank pain and frequency.  Musculoskeletal: Negative for arthralgias, back pain, joint swelling and neck pain.  Skin: Negative for rash.  Allergic/Immunologic: Positive for environmental allergies.  Neurological: Negative for dizziness, tremors, numbness and headaches.  Hematological: Negative for adenopathy. Does not bruise/bleed easily.  Psychiatric/Behavioral: Negative for  behavioral problems (Depression), sleep disturbance and suicidal ideas. The patient is not nervous/anxious.      Today's Vitals   01/24/19 1116  BP: 114/80  Pulse: 88  Resp: 16  SpO2: 99%  Weight: 172 lb 6.4 oz (78.2 kg)  Height: 5\' 1"  (1.549 m)   Body mass index is 32.57 kg/m.  Physical Exam Vitals signs and nursing note reviewed.  Constitutional:      General: She is  not in acute distress.    Appearance: Normal appearance. She is well-developed. She is not diaphoretic.  HENT:     Head: Normocephalic and atraumatic.     Nose: Nose normal.     Mouth/Throat:     Pharynx: No oropharyngeal exudate.  Eyes:     Pupils: Pupils are equal, round, and reactive to light.  Neck:     Musculoskeletal: Normal range of motion and neck supple.     Thyroid: No thyromegaly.     Vascular: No carotid bruit or JVD.     Trachea: No tracheal deviation.  Cardiovascular:     Rate and Rhythm: Normal rate and regular rhythm.     Pulses: Normal pulses.     Heart sounds: Normal heart sounds. No murmur. No friction rub. No gallop.   Pulmonary:     Effort: Pulmonary effort is normal. No respiratory distress.     Breath sounds: Normal breath sounds. No wheezing or rales.  Chest:     Chest wall: No tenderness.  Abdominal:     General: Bowel sounds are normal.     Palpations: Abdomen is soft. Abdomen is not rigid.     Tenderness: There is no abdominal tenderness. There is no guarding or rebound.  Musculoskeletal: Normal range of motion.  Lymphadenopathy:     Cervical: No cervical adenopathy.  Skin:    General: Skin is warm and dry.     Capillary Refill: Capillary refill takes less than 2 seconds.  Neurological:     Mental Status: She is alert and oriented to person, place, and time.     Cranial Nerves: No cranial nerve deficit.  Psychiatric:        Behavior: Behavior normal.        Thought Content: Thought content normal.        Judgment: Judgment normal.     Assessment/Plan:  1. Encounter for general adult medical examination with abnormal findings Annual health maintenance exam today.   2. Mild asthma without complication, unspecified whether persistent Renew rescue inhaler. Use as needed and as prescribed.  - albuterol (VENTOLIN HFA) 108 (90 Base) MCG/ACT inhaler; Inhale 1 puff into the lungs every 6 (six) hours as needed for wheezing or shortness of breath.   Dispense: 1 Inhaler; Refill: 5  3. Leg cramps Started taking OTC potassium and magnesium supplements and increased water intake. These measures have improved severity and frequency of leg cramps. Will monitor.   4. Screening for colon cancer Refer to GI for screening colonoscopy. - Ambulatory referral to Gastroenterology  General Counseling: Oluwasemilore verbalizes understanding of the findings of todays visit and agrees with plan of treatment. I have discussed any further diagnostic evaluation that may be needed or ordered today. We also reviewed her medications today. she has been encouraged to call the office with any questions or concerns that should arise related to todays visit.    Counseling:  This patient was seen by Leretha Pol FNP Collaboration with Dr Lavera Guise as a part of collaborative care agreement  Orders Placed This Encounter  Procedures  . Ambulatory referral to Gastroenterology    Meds ordered this encounter  Medications  . albuterol (VENTOLIN HFA) 108 (90 Base) MCG/ACT inhaler    Sig: Inhale 1 puff into the lungs every 6 (six) hours as needed for wheezing or shortness of breath.    Dispense:  1 Inhaler    Refill:  5    Order Specific Question:   Supervising Provider    Answer:   Lavera Guise [[id number]]    Time spent: [place], MD  Internal Medicine

## 2019-02-03 ENCOUNTER — Other Ambulatory Visit: Payer: Self-pay | Admitting: Nurse Practitioner

## 2019-02-03 ENCOUNTER — Other Ambulatory Visit: Payer: Self-pay | Admitting: Internal Medicine

## 2019-02-03 DIAGNOSIS — Z1231 Encounter for screening mammogram for malignant neoplasm of breast: Secondary | ICD-10-CM

## 2019-02-10 DIAGNOSIS — R252 Cramp and spasm: Secondary | ICD-10-CM | POA: Insufficient documentation

## 2019-02-10 DIAGNOSIS — Z1211 Encounter for screening for malignant neoplasm of colon: Secondary | ICD-10-CM | POA: Insufficient documentation

## 2019-02-12 ENCOUNTER — Ambulatory Visit
Admission: RE | Admit: 2019-02-12 | Discharge: 2019-02-12 | Disposition: A | Discharge status: Home / Self Care | Payer: Managed Care, Other (non HMO) | Source: Ambulatory Visit | Attending: Nurse Practitioner | Admitting: Nurse Practitioner

## 2019-02-12 ENCOUNTER — Other Ambulatory Visit: Payer: Self-pay

## 2019-02-12 DIAGNOSIS — Z1231 Encounter for screening mammogram for malignant neoplasm of breast: Secondary | ICD-10-CM | POA: Insufficient documentation

## 2019-03-20 HISTORY — PX: COLONOSCOPY: SHX5424

## 2019-03-20 NOTE — Progress Notes (Signed)
Gynecology Annual Exam  PCP: Ronnell Freshwater, NP  Chief Complaint:  Chief Complaint  Patient presents with  . Gynecologic Exam    History of Present Illness:  Jennifer White is a 48 year old African American/Black female, [place], who presents for her annual exam. She is not having any significant gyn problems. She continues to have hot flashes and  has been taking vitamin E for hot flashes. She is aware of alternate treatments for hot flashes. Her menses are absent due to a laprascopic supracervical hysterectomy for fibroids/menorrhagia in 2014.  She has had no spotting.   The patient's past medical history is remarkable for endometriosis, adenomyosis and fibroids. . She had a left oophorectomy in 2015 for an ovarian torsion, but still has her right ovary. Since her last annual GYN exam dated 02/04/2018, she reports no significant changes in her health.   She is sexually active. She denies dyspareunia. Her most recent pap smear was obtained 02/04/2018 and was negative.  Her most recent mammogram obtained on 02/12/2019 was normal.  There is no family history of breast cancer.  There is no family history of ovarian cancer.  The patient does  do monthly self breast exams.  She had a colonoscopy yesterday 03/20/2019. Two small polyps were removed and pathology is pending.  The patient had a D[facility] scan at [facility] which showed osteopenia. The patient does not smoke.  The patient does not drink alcohol.  The patient does not use illegal drugs.  The patient exercises occasionally and tries to walk for exercise. .  Current BMI=33.27 kg/m2.  The patient may not get adequate calcium in her diet (lactose intolerant)  and she takes a vitamin D3 supplement. She had a recent cholesterol screen at  Her PCP in 2019  and results were normal   Review of Systems: Review of Systems  Constitutional: Negative for chills, fever and weight loss.       Positive weight gain of 4#  HENT:  Negative for congestion, sinus pain and sore throat.   Eyes: Negative for blurred vision and pain.  Respiratory: Negative for hemoptysis, shortness of breath and wheezing.   Cardiovascular: Negative for chest pain, palpitations and leg swelling.  Gastrointestinal: Negative for abdominal pain, blood in stool, constipation, diarrhea, heartburn, nausea and vomiting.  Genitourinary: Negative for dysuria, flank pain (left), frequency, hematuria and urgency.  Musculoskeletal: Negative for back pain, joint pain and myalgias.  Skin: Negative for itching and rash.  Neurological: Negative for dizziness, tingling and headaches.  Endo/Heme/Allergies: Negative for environmental allergies and polydipsia. Does not bruise/bleed easily.        Positive for hot flashes   Psychiatric/Behavioral: Negative for depression. The patient is not nervous/anxious and does not have insomnia.     Past Medical History:  Past Medical History:  Diagnosis Date  . Anemia   . Asthma   . Endometriosis   . Fibroid   . Lactose intolerance   . Vitamin D deficiency     Past Surgical History:  Past Surgical History:  Procedure Laterality Date  . CESAREAN SECTION     1993/2002  . CHOLECYSTECTOMY, LAPAROSCOPIC  2004  . COLONOSCOPY  2011   normal  . DILATION AND CURETTAGE OF UTERUS  1988   septic AB/PID/+GC  . LAPAROSCOPIC SUPRACERVICAL HYSTERECTOMY  09/24/2013   and bilateral salpingectomy, minilaparotomy and cystoscopy  . OOPHORECTOMY  10/28/2013   left for ovarian torsion     Obstetric History: [id number]  Cesarean section x 2  Family History:  Family History  Problem Relation Age of Onset  . Hypertension Father   . Lung cancer Father 14  . Atrial fibrillation Father   . Colon cancer Paternal Grandmother 78       had a blockage but unsure if colon cancer or stomach cancer  . Diabetes Paternal Grandmother        amputee  . Hypertension Paternal Grandmother   . Prostate cancer Paternal Grandfather   .  Arthritis Mother   . Breast cancer Neg Hx     Social History:    Allergies:  No Known Allergies  Medications: Prior to Admission medications   Medication Sig Start Date End Date Taking? Authorizing Provider  cholecalciferol (VITAMIN D) 1000 units tablet Take 1,000 Units by mouth daily.   Yes Historical Provider, MD  vitamin C (ASCORBIC ACID) 500 MG tablet Take 500 mg by mouth daily.   Yes Historical Provider, MD  vitamin E (VITAMIN E) 400 UNIT capsule Take 400 Units by mouth daily.   Yes Historical Provider, MD  Albuterol inhaler prn  Physical Exam Vitals: BP (!) 100/50   Pulse 79   Ht 5\' 1"  (1.549 m)   Wt 176 lb (79.8 kg)   BMI 33.25 kg/m   General: BF in NAD HEENT: normocephalic, anicteric Thyroid: no enlargement, no palpable nodules Pulmonary: No increased work of breathing, CTAB Cardiovascular: RRR without murmur Breast: Breast symmetrical, no tenderness, no palpable nodules or masses, no skin or nipple retraction present, no nipple discharge.  No axillary, infraclavicular, or supraclavicular lymphadenopathy. Abdomen:  soft, non-tender, non-distended.  Umbilicus without lesions.  No hepatomegaly or masses palpable. No evidence of hernia. Surgical scars noted Genitourinary:  External: Normal external female genitalia.  Normal urethral meatus, normal Bartholin's and Skene's glands.    Vagina: Normal vaginal mucosa, no evidence of prolapse.    Cervix: pink, no lesions, stenotic  Uterus: surgically absent  Adnexa:  no adnexal masses, NT  Rectal: deferred  Lymphatic: no evidence of inguinal lymphadenopathy Extremities: no edema, erythema, or tenderness Neurologic: Grossly intact Psychiatric: mood appropriate, affect full      Assessment: 48 y.o. [id number] well woman exam Vasomotor symptoms  Plan:   1) Mammogram - recommend continued yearly screening mammogram and monthly SBE  2) Pap done  3) Routine healthcare maintenance including cholesterol, diabetes  screening discussed managed by PCP . Encourage increased resistance exercise for bone health.  4) Colon cancer screening: colonoscopy UTD  5) RTO 1 year and prn.  Dalia Heading, CNM

## 2019-03-21 ENCOUNTER — Other Ambulatory Visit: Payer: Self-pay

## 2019-03-21 ENCOUNTER — Other Ambulatory Visit (HOSPITAL_COMMUNITY)
Admission: RE | Admit: 2019-03-21 | Discharge: 2019-03-21 | Disposition: A | Discharge status: Home / Self Care | Payer: Managed Care, Other (non HMO) | Source: Ambulatory Visit | Attending: Certified Nurse Midwife | Admitting: Certified Nurse Midwife

## 2019-03-21 ENCOUNTER — Encounter: Payer: Self-pay | Admitting: Certified Nurse Midwife

## 2019-03-21 ENCOUNTER — Ambulatory Visit (INDEPENDENT_AMBULATORY_CARE_PROVIDER_SITE_OTHER): Payer: Managed Care, Other (non HMO) | Admitting: Certified Nurse Midwife

## 2019-03-21 VITALS — BP 100/50 | HR 79 | Ht 61.0 in | Wt 176.0 lb

## 2019-03-21 DIAGNOSIS — Z01419 Encounter for gynecological examination (general) (routine) without abnormal findings: Secondary | ICD-10-CM | POA: Diagnosis not present

## 2019-03-21 DIAGNOSIS — Z124 Encounter for screening for malignant neoplasm of cervix: Secondary | ICD-10-CM | POA: Insufficient documentation

## 2019-03-25 LAB — CYTOLOGY - PAP: Diagnosis: NEGATIVE

## 2019-03-26 ENCOUNTER — Telehealth: Payer: Self-pay

## 2019-03-26 NOTE — Telephone Encounter (Signed)
Pt calling; wants to know her blood type.  [phone]

## 2019-04-01 NOTE — Telephone Encounter (Signed)
Pt aware her blood type is O+.  I apologized it took so long.  She graciously understood.

## 2020-01-12 ENCOUNTER — Other Ambulatory Visit: Payer: Self-pay

## 2020-01-12 ENCOUNTER — Ambulatory Visit: Payer: Managed Care, Other (non HMO) | Attending: Internal Medicine

## 2020-01-12 DIAGNOSIS — Z23 Encounter for immunization: Secondary | ICD-10-CM

## 2020-01-12 NOTE — Progress Notes (Signed)
   Covid-19 Vaccination Clinic  Name:  Jennifer White    MRN: [id number] DOB: 04-24-1971  01/12/2020  Ms. Portal was observed post Covid-19 immunization for 15 minutes without incident. She was provided with Vaccine Information Sheet and instruction to access the V-Safe system.   [name] was instructed to call 911 with any severe reactions post vaccine: Marland Kitchen Difficulty breathing  . Swelling of face and throat  . A fast heartbeat  . A bad rash all over body  . Dizziness and weakness   Immunizations Administered    Name Date Dose VIS Date Route   Pfizer COVID-19 Vaccine 01/12/2020 10:51 AM 0.3 mL 11/19/2018 Intramuscular   Manufacturer: [employer]   Lot: [id number]   [place]: [id number]

## 2020-02-04 ENCOUNTER — Ambulatory Visit: Payer: Managed Care, Other (non HMO) | Attending: Internal Medicine

## 2020-02-04 DIAGNOSIS — Z23 Encounter for immunization: Secondary | ICD-10-CM

## 2020-02-04 NOTE — Progress Notes (Signed)
   Covid-19 Vaccination Clinic  Name:  Jennifer White    MRN: [id number] DOB: April 01, 1971  02/04/2020  [name] was observed post Covid-19 immunization for 15 minutes without incident. She was provided with Vaccine Information Sheet and instruction to access the V-Safe system.   [name] was instructed to call 911 with any severe reactions post vaccine: Marland Kitchen Difficulty breathing  . Swelling of face and throat  . A fast heartbeat  . A bad rash all over body  . Dizziness and weakness   Immunizations Administered    Name Date Dose VIS Date Route   Pfizer COVID-19 Vaccine 02/04/2020  8:32 AM 0.3 mL 11/19/2018 Intramuscular   Manufacturer: [place]   Lot: [id number]   [place]: [id number]

## 2020-02-10 ENCOUNTER — Other Ambulatory Visit: Payer: Self-pay | Admitting: Nurse Practitioner

## 2020-02-10 DIAGNOSIS — Z1231 Encounter for screening mammogram for malignant neoplasm of breast: Secondary | ICD-10-CM

## 2020-03-10 ENCOUNTER — Ambulatory Visit
Admission: RE | Admit: 2020-03-10 | Discharge: 2020-03-10 | Disposition: A | Discharge status: Home / Self Care | Payer: Managed Care, Other (non HMO) | Source: Ambulatory Visit | Attending: Nurse Practitioner | Admitting: Nurse Practitioner

## 2020-03-10 DIAGNOSIS — Z1231 Encounter for screening mammogram for malignant neoplasm of breast: Secondary | ICD-10-CM | POA: Insufficient documentation

## 2020-03-22 NOTE — Progress Notes (Signed)
Gynecology Annual Exam  PCP: Ronnell Freshwater, NP  Chief Complaint:  Chief Complaint  Patient presents with  . Gynecologic Exam    no concerns  . [employer] Employee    History of Present Illness:  Jennifer White. Jennifer White is a 49 year old African American/Black female, [place], who presents for her annual exam. She is having problems with back pain that her chiropractor feels may be from her macromastia. She currently wears a 38 DDD bra and has pain in her shoulders from the bra straps digging into her shoulders. She is interested in a breast reduction. Her menses are absent due to a laprascopic supracervical hysterectomy for fibroids/menorrhagia in 2014.  She has had no spotting. Her hot flashes are not as frequent over the past year. She continues to take vitamin E for the hot flashes.  The patient's past medical history is remarkable for endometriosis, adenomyosis and fibroids. . She had a left oophorectomy in 2015 for an ovarian torsion, but still has her right ovary. Since her last annual GYN exam dated 03/21/2019, she reports no other significant changes in her health. She has completed her Covid vaccines.    She is sexually active. She denies dyspareunia. Her most recent pap smear was obtained 03/21/2019 and was negative.  Her most recent mammogram obtained on 03/10/2020 was normal.  There is no family history of breast cancer.  There is no family history of ovarian cancer.  The patient does  do monthly self breast exams.  She had a colonoscopy yesterday 03/20/2019. Two small polyps were removed. Patient reports that the pathology returned benign.  The patient had a DEXA scan 2016 which showed osteopenia of the spine (T score -1.4) but she had a normal T score of the femur neck The patient does not smoke.  The patient does not drink alcohol.  The patient does not use illegal drugs.  The patient exercises occasionally and tries to walk for exercise. .  Current BMI=34.78 kg/m2.  SHe has gained 8# in the last year The patient may not get adequate calcium in her diet (lactose intolerant)  and she takes a vitamin D3 supplement. She had a recent cholesterol screen at  Her PCP in 2020  and results were normal   Review of Systems: Review of Systems  Constitutional: Negative for chills, fever and weight loss.       Positive weight gain of 8#  HENT: Negative for congestion, sinus pain and sore throat.   Eyes: Negative for blurred vision and pain.  Respiratory: Negative for hemoptysis, shortness of breath and wheezing.   Cardiovascular: Negative for chest pain, palpitations and leg swelling.  Gastrointestinal: Negative for abdominal pain, blood in stool, constipation, diarrhea, heartburn, nausea and vomiting.  Genitourinary: Negative for dysuria, flank pain, frequency, hematuria and urgency.  Musculoskeletal: Positive for back pain. Negative for joint pain and myalgias.  Skin: Negative for itching and rash.  Neurological: Negative for dizziness, tingling and headaches.  Endo/Heme/Allergies: Negative for environmental allergies and polydipsia. Does not bruise/bleed easily.        Positive for hot flashes   Psychiatric/Behavioral: Negative for depression. The patient is not nervous/anxious and does not have insomnia.     Past Medical History:  Past Medical History:  Diagnosis Date  . Anemia   . Asthma   . Endometriosis   . Fibroid   . Lactose intolerance   . Vitamin D deficiency     Past Surgical History:  Past Surgical  History:  Procedure Laterality Date  . ABDOMINAL HYSTERECTOMY    . CESAREAN SECTION     1993/2002  . CHOLECYSTECTOMY, LAPAROSCOPIC  2004  . COLONOSCOPY  2011   normal  . DILATION AND CURETTAGE OF UTERUS  1988   septic AB/PID/+GC  . LAPAROSCOPIC SUPRACERVICAL HYSTERECTOMY  09/24/2013   and bilateral salpingectomy, minilaparotomy and cystoscopy  . OOPHORECTOMY  10/28/2013   left for ovarian torsion     Obstetric History: [id number]  Cesarean section x 2  Family History:  Family History  Problem Relation Age of Onset  . Hypertension Father   . Lung cancer Father 28  . Atrial fibrillation Father   . Colon cancer Paternal Grandmother 58       had a blockage but unsure if colon cancer or stomach cancer  . Diabetes Paternal Grandmother        amputee  . Hypertension Paternal Grandmother   . Prostate cancer Paternal Grandfather   . Arthritis Mother   . Breast cancer Neg Hx     Social History:  Social History   Socioeconomic History  . Marital status: Married    Spouse name: Not on file  . Number of children: 2  . Years of education: Not on file  . Highest education level: Not on file  Occupational History  . Occupation: Customer service manager  Tobacco Use  . Smoking status: Never Smoker  . Smokeless tobacco: Never Used  Vaping Use  . Vaping Use: Never used  Substance and Sexual Activity  . Alcohol use: No  . Drug use: No  . Sexual activity: Yes    Partners: Male    Birth control/protection: Surgical    Comment: Hysterectomy  Other Topics Concern  . Not on file  Social History Narrative  . Not on file   Social Determinants of Health   Financial Resource Strain:   . Difficulty of Paying Living Expenses:   Food Insecurity:   . Worried About Charity fundraiser in the Last Year:   . Arboriculturist in the Last Year:   Transportation Needs:   . Film/video editor (Medical):   Marland Kitchen Lack of Transportation (Non-Medical):   Physical Activity:   . Days of Exercise per Week:   . Minutes of Exercise per Session:   Stress:   . Feeling of Stress :   Social Connections:   . Frequency of Communication with Friends and Family:   . Frequency of Social Gatherings with Friends and Family:   . Attends Religious Services:   . Active Member of Clubs or Organizations:   . Attends Archivist Meetings:   Marland Kitchen Marital Status:   Intimate Partner Violence:   . Fear of Current or Ex-Partner:   .  Emotionally Abused:   Marland Kitchen Physically Abused:   . Sexually Abused:     Allergies:  No Known Allergies  Medications: Prior to Admission medications   Medication Sig Start Date End Date Taking? Authorizing Provider  cholecalciferol (VITAMIN D) 1000 units tablet Take 1,000 Units by mouth daily.   Yes Historical Provider, MD  vitamin C (ASCORBIC ACID) 500 MG tablet Take 500 mg by mouth daily.   Yes Historical Provider, MD  vitamin E (VITAMIN E) 400 UNIT capsule Take 400 Units by mouth daily.   Yes Historical Provider, MD  Albuterol inhaler prn  Physical Exam Vitals: BP 114/70   Ht 5\' 1"  (1.549 m)   Wt 184 lb (83.5 kg)   BMI 34.77  kg/m   General: BF in NAD HEENT: normocephalic, anicteric Thyroid: no enlargement, no palpable nodules Pulmonary: No increased work of breathing, CTAB Cardiovascular: RRR without murmur Breast: Large breasts, symmetrical, no tenderness, no palpable nodules or masses, no skin or nipple retraction present, no nipple discharge.  No axillary, infraclavicular, or supraclavicular lymphadenopathy. Bra strap marks present on shoulders Abdomen:  soft, non-tender, non-distended.  Umbilicus without lesions.  No hepatomegaly or masses palpable. No evidence of hernia. Surgical scars noted Genitourinary:  External: Normal external female genitalia.  Normal urethral meatus, normal Bartholin's and Skene's glands.    Vagina: Normal vaginal mucosa, no evidence of prolapse.    Cervix: pink, no lesions, stenotic  Uterus: surgically absent  Adnexa:  no adnexal masses, NT  Rectal: deferred  Lymphatic: no evidence of inguinal lymphadenopathy Extremities: no edema, erythema, or tenderness Neurologic: Grossly intact Psychiatric: mood appropriate, affect full      Assessment: 49 y.o. [id number] well woman exam Macromastia-recommend seeing a plastic surgeon to discuss breast reduction   Plan:   1) Mammogram - recommend continued yearly screening mammogram and monthly SBE.    2) Cervical cancer screening:  Pap done  3) Routine healthcare maintenance including cholesterol, diabetes screening  managed by PCP .  4) Colon cancer screening: colonoscopy UTD  5) Discussed calcium and vitamin D3 requirements.  5) RTO 1 year and prn.  Dalia Heading, CNM

## 2020-03-23 ENCOUNTER — Encounter: Payer: Self-pay | Admitting: Certified Nurse Midwife

## 2020-03-23 ENCOUNTER — Ambulatory Visit (INDEPENDENT_AMBULATORY_CARE_PROVIDER_SITE_OTHER): Payer: Managed Care, Other (non HMO) | Admitting: Certified Nurse Midwife

## 2020-03-23 ENCOUNTER — Other Ambulatory Visit: Payer: Self-pay

## 2020-03-23 VITALS — BP 114/70 | Ht 61.0 in | Wt 184.0 lb

## 2020-03-23 DIAGNOSIS — Z01419 Encounter for gynecological examination (general) (routine) without abnormal findings: Secondary | ICD-10-CM

## 2020-03-23 DIAGNOSIS — N62 Hypertrophy of breast: Secondary | ICD-10-CM | POA: Insufficient documentation

## 2020-03-23 DIAGNOSIS — Z124 Encounter for screening for malignant neoplasm of cervix: Secondary | ICD-10-CM

## 2020-03-26 LAB — IGP, APTIMA HPV: HPV Aptima: NEGATIVE

## 2020-03-30 ENCOUNTER — Other Ambulatory Visit: Payer: Self-pay | Admitting: Nurse Practitioner

## 2020-03-31 ENCOUNTER — Telehealth: Payer: Self-pay

## 2020-03-31 LAB — COMPREHENSIVE METABOLIC PANEL
ALT: 32 IU/L (ref 0–32)
AST: 21 IU/L (ref 0–40)
Albumin/Globulin Ratio: 1.9 (ref 1.2–2.2)
Albumin: 4.3 g/dL (ref 3.8–4.9)
Alkaline Phosphatase: 95 IU/L (ref 48–121)
BUN/Creatinine Ratio: 16 (ref 9–23)
BUN: 12 mg/dL (ref 6–24)
Bilirubin Total: 0.6 mg/dL (ref 0.0–1.2)
CO2: 23 mmol/L (ref 20–29)
Calcium: 9.3 mg/dL (ref 8.7–10.2)
Chloride: 107 mmol/L — ABNORMAL HIGH (ref 96–106)
Creatinine, Ser: 0.76 mg/dL (ref 0.57–1.00)
GFR calc Af Amer: 105 mL/min/{1.73_m2} (ref >59)
GFR calc non Af Amer: 91 mL/min/{1.73_m2} (ref >59)
Globulin, Total: 2.3 g/dL (ref 1.5–4.5)
Glucose: 87 mg/dL (ref 65–99)
Potassium: 3.8 mmol/L (ref 3.5–5.2)
Sodium: 141 mmol/L (ref 134–144)
Total Protein: 6.6 g/dL (ref 6.0–8.5)

## 2020-03-31 LAB — VITAMIN D 25 HYDROXY (VIT D DEFICIENCY, FRACTURES): Vit D, 25-Hydroxy: 29.4 ng/mL — ABNORMAL LOW (ref 30.0–100.0)

## 2020-03-31 LAB — LIPID PANEL W/O CHOL/HDL RATIO
Cholesterol, Total: 180 mg/dL (ref 100–199)
HDL: 56 mg/dL (ref >39)
LDL Chol Calc (NIH): 113 mg/dL — ABNORMAL HIGH (ref 0–99)
Triglycerides: 55 mg/dL (ref 0–149)
VLDL Cholesterol Cal: 11 mg/dL (ref 5–40)

## 2020-03-31 LAB — CBC
Hematocrit: 39.2 % (ref 34.0–46.6)
Hemoglobin: 13.1 g/dL (ref 11.1–15.9)
MCH: 29.2 pg (ref 26.6–33.0)
MCHC: 33.4 g/dL (ref 31.5–35.7)
MCV: 87 fL (ref 79–97)
Platelets: 198 10*3/uL (ref 150–450)
RBC: 4.49 x10E6/uL (ref 3.77–5.28)
RDW: 13 % (ref 11.7–15.4)
WBC: 5.9 10*3/uL (ref 3.4–10.8)

## 2020-03-31 LAB — TSH: TSH: 2.65 u[IU]/mL (ref 0.450–4.500)

## 2020-03-31 LAB — T4, FREE: Free T4: 1.15 ng/dL (ref 0.82–1.77)

## 2020-03-31 LAB — T3: T3, Total: 99 ng/dL (ref 71–180)

## 2020-03-31 NOTE — Telephone Encounter (Signed)
Confirmed and screened for 04-02-20 ov.

## 2020-04-01 NOTE — Progress Notes (Signed)
Labs good. Discuss at visit 04/02/2020

## 2020-04-01 NOTE — Progress Notes (Signed)
Negative mammogram

## 2020-04-02 ENCOUNTER — Ambulatory Visit (INDEPENDENT_AMBULATORY_CARE_PROVIDER_SITE_OTHER): Payer: Managed Care, Other (non HMO) | Admitting: Nurse Practitioner

## 2020-04-02 ENCOUNTER — Other Ambulatory Visit: Payer: Self-pay

## 2020-04-02 VITALS — BP 116/76 | HR 72 | Temp 97.5°F | Resp 16 | Ht 61.0 in | Wt 180.8 lb

## 2020-04-02 DIAGNOSIS — E7841 Elevated Lipoprotein(a): Secondary | ICD-10-CM | POA: Diagnosis not present

## 2020-04-02 DIAGNOSIS — Z0001 Encounter for general adult medical examination with abnormal findings: Secondary | ICD-10-CM

## 2020-04-02 DIAGNOSIS — I499 Cardiac arrhythmia, unspecified: Secondary | ICD-10-CM

## 2020-04-02 DIAGNOSIS — R300 Dysuria: Secondary | ICD-10-CM | POA: Diagnosis not present

## 2020-04-02 NOTE — Progress Notes (Signed)
[facility] [place], [place] [address]  Internal MEDICINE  Office Visit Note  Patient Name: Jennifer White  [id number]  [id number]  Date of Service: 04/07/2020   Pt is here for routine health maintenance examination  Chief Complaint  Patient presents with  . Annual Exam    Questions about bloodwork done on 03/30/20  . Anemia  . Asthma  . Quality Metric Gaps    TDAP  . Dizziness    Has been off balance  . Heart Murmur    Has irregular heart beat, wondering if this will be a concern for wisdom tooth removal      The patient is here for annual wellness visit. She states that she has had a few episodes of chest discomfort and palpitations. Episodes generally cause her to feel dizzy. Marland Kitchen Happen off and on. States that last episode of this was a few days ago. She did have routine, fasting labs done prior to this visit. She had very mild vitamin d deficiency. LDL was 113 with the remainder of the lipid panel being normal. Her other labs were normal.     Current Medication: Outpatient Encounter Medications as of 04/02/2020  Medication Sig  . albuterol (VENTOLIN HFA) 108 (90 Base) MCG/ACT inhaler Inhale 1 puff into the lungs every 6 (six) hours as needed for wheezing or shortness of breath.  . cholecalciferol (VITAMIN D) 1000 units tablet Take 1,000 Units by mouth daily.  . vitamin C (ASCORBIC ACID) 500 MG tablet Take 500 mg by mouth daily.  . vitamin E (VITAMIN E) 400 UNIT capsule Take 400 Units by mouth daily.   No facility-administered encounter medications on file as of 04/02/2020.    Surgical History: Past Surgical History:  Procedure Laterality Date  . ABDOMINAL HYSTERECTOMY    . CESAREAN SECTION     1993/2002  . CHOLECYSTECTOMY, LAPAROSCOPIC  2004  . COLONOSCOPY  2011   normal  . COLONOSCOPY  03/20/2019  . DILATION AND CURETTAGE OF UTERUS  1988   septic AB/PID/+GC  . LAPAROSCOPIC SUPRACERVICAL HYSTERECTOMY  09/24/2013   and bilateral  salpingectomy, minilaparotomy and cystoscopy  . OOPHORECTOMY  10/28/2013   left for ovarian torsion    Medical History: Past Medical History:  Diagnosis Date  . Anemia   . Asthma   . Endometriosis   . Fibroid   . Lactose intolerance   . Vitamin D deficiency     Family History: Family History  Problem Relation Age of Onset  . Hypertension Father   . Lung cancer Father 40  . Atrial fibrillation Father   . Colon cancer Paternal Grandmother 2       had a blockage but unsure if colon cancer or stomach cancer  . Diabetes Paternal Grandmother        amputee  . Hypertension Paternal Grandmother   . Prostate cancer Paternal Grandfather   . Arthritis Mother   . Breast cancer Neg Hx       Review of Systems  Constitutional: Negative for activity change, chills, fatigue and unexpected weight change.  HENT: Negative for congestion, postnasal drip, rhinorrhea, sneezing and sore throat.   Respiratory: Negative for cough, chest tightness, shortness of breath and wheezing.   Cardiovascular: Positive for palpitations. Negative for chest pain.       Several episodes of palpitations which lead to dizziness and a feeling of being "off balance."  Gastrointestinal: Negative for abdominal pain, constipation, diarrhea, nausea and vomiting.  Endocrine: Negative for cold  intolerance, heat intolerance, polydipsia and polyuria.  Genitourinary: Negative for dysuria, frequency and urgency.  Musculoskeletal: Negative for arthralgias, back pain, joint swelling and neck pain.  Skin: Negative for rash.  Allergic/Immunologic: Negative for environmental allergies.  Neurological: Negative for dizziness, tremors, numbness and headaches.  Hematological: Negative for adenopathy. Does not bruise/bleed easily.  Psychiatric/Behavioral: Negative for behavioral problems (Depression), sleep disturbance and suicidal ideas. The patient is nervous/anxious.      Today's Vitals   04/02/20 1422  BP: 116/76  Pulse:  72  Resp: 16  Temp: (!) 97.5 F (36.4 C)  SpO2: 100%  Weight: 180 lb 12.8 oz (82 kg)  Height: '5\' 1"'  (1.549 m)   Body mass index is 34.16 kg/m.  Physical Exam Vitals and nursing note reviewed.  Constitutional:      General: She is not in acute distress.    Appearance: Normal appearance. She is well-developed. She is not diaphoretic.  HENT:     Head: Normocephalic and atraumatic.     Nose: Nose normal.     Mouth/Throat:     Pharynx: No oropharyngeal exudate.  Eyes:     Pupils: Pupils are equal, round, and reactive to light.  Neck:     Thyroid: No thyromegaly.     Vascular: No JVD.     Trachea: No tracheal deviation.  Cardiovascular:     Rate and Rhythm: Normal rate and regular rhythm.     Pulses: Normal pulses.     Heart sounds: Normal heart sounds. No murmur heard.  No friction rub. No gallop.      Comments: ECG in the office showing fe PACs. Was within normal limits.  Pulmonary:     Effort: Pulmonary effort is normal. No respiratory distress.     Breath sounds: Normal breath sounds. No wheezing or rales.  Chest:     Chest wall: No tenderness.  Abdominal:     General: Bowel sounds are normal.     Palpations: Abdomen is soft.     Tenderness: There is no abdominal tenderness.  Musculoskeletal:        General: Normal range of motion.     Cervical back: Normal range of motion and neck supple.  Lymphadenopathy:     Cervical: No cervical adenopathy.  Skin:    General: Skin is warm and dry.  Neurological:     Mental Status: She is alert and oriented to person, place, and time.     Cranial Nerves: No cranial nerve deficit.  Psychiatric:        Attention and Perception: Attention and perception normal.        Mood and Affect: Affect normal. Mood is anxious.        Speech: Speech normal.        Behavior: Behavior normal. Behavior is cooperative.        Thought Content: Thought content normal.        Cognition and Memory: Cognition and memory normal.        Judgment:  Judgment normal.    LABS: Recent Results (from the past 2160 hour(s))  IGP, Aptima HPV     Status: None   Collection Time: 03/23/20  4:46 PM  Result Value Ref Range   Interpretation NILM     Comment: NEGATIVE FOR INTRAEPITHELIAL LESION OR MALIGNANCY.   Category NIL     Comment: Negative for Intraepithelial Lesion   Adequacy ENDO     Comment: Satisfactory for evaluation. Endocervical and/or squamous metaplastic cells (endocervical component) are present.  Clinician Provided ICD10 Comment     Comment: Z12.4   Performed by: Comment     Comment: Shan Levans, Cytotechnologist (ASCP)   Note: Comment     Comment: The Pap smear is a screening test designed to aid in the detection of premalignant and malignant conditions of the uterine cervix.  It is not a diagnostic procedure and should not be used as the sole means of detecting cervical cancer.  Both false-positive and false-negative reports do occur.    Test Methodology Comment     Comment: This liquid based ThinPrep(R) pap test was screened with the use of an image guided system.    HPV Aptima Negative Negative    Comment: This nucleic acid amplification test detects fourteen high-risk HPV types (16,18,31,33,35,39,45,51,52,56,58,59,66,68) without differentiation.   Comprehensive metabolic panel     Status: Abnormal   Collection Time: 03/30/20  3:07 PM  Result Value Ref Range   Glucose 87 65 - 99 mg/dL   BUN 12 6 - 24 mg/dL   Creatinine, Ser 0.76 0.57 - 1.00 mg/dL   GFR calc non Af Amer 91 >59 mL/min/1.73   GFR calc Af Amer 105 >59 mL/min/1.73    Comment: **[facility] currently reports eGFR in compliance with the current**   recommendations of the [employer]. [facility] will   update reporting as new guidelines are published from the NKF-ASN   Task force.    BUN/Creatinine Ratio 16 9 - 23   Sodium 141 134 - 144 mmol/L   Potassium 3.8 3.5 - 5.2 mmol/L   Chloride 107 (H) 96 - 106 mmol/L   CO2 23 20 - 29  mmol/L   Calcium 9.3 8.7 - 10.2 mg/dL   Total Protein 6.6 6.0 - 8.5 g/dL   Albumin 4.3 3.8 - 4.9 g/dL   Globulin, Total 2.3 1.5 - 4.5 g/dL   Albumin/Globulin Ratio 1.9 1.2 - 2.2   Bilirubin Total 0.6 0.0 - 1.2 mg/dL   Alkaline Phosphatase 95 48 - 121 IU/L   AST 21 0 - 40 IU/L   ALT 32 0 - 32 IU/L  CBC     Status: None   Collection Time: 03/30/20  3:07 PM  Result Value Ref Range   WBC 5.9 3.4 - 10.8 x10E3/uL   RBC 4.49 3.77 - 5.28 x10E6/uL   Hemoglobin 13.1 11.1 - 15.9 g/dL   Hematocrit 39.2 34.0 - 46.6 %   MCV 87 79 - 97 fL   MCH 29.2 26.6 - 33.0 pg   MCHC 33.4 31 - 35 g/dL   RDW 13.0 11.7 - 15.4 %   Platelets 198 150 - 450 x10E3/uL  Lipid Panel w/o Chol/HDL Ratio     Status: Abnormal   Collection Time: 03/30/20  3:07 PM  Result Value Ref Range   Cholesterol, Total 180 100 - 199 mg/dL   Triglycerides 55 0 - 149 mg/dL   HDL 56 >39 mg/dL   VLDL Cholesterol Cal 11 5 - 40 mg/dL   LDL Chol Calc (NIH) 113 (H) 0 - 99 mg/dL  T4, free     Status: None   Collection Time: 03/30/20  3:07 PM  Result Value Ref Range   Free T4 1.15 0.82 - 1.77 ng/dL  TSH     Status: None   Collection Time: 03/30/20  3:07 PM  Result Value Ref Range   TSH 2.650 0.450 - 4.500 uIU/mL  VITAMIN D 25 Hydroxy (Vit-D Deficiency, Fractures)     Status: Abnormal   Collection  Time: 03/30/20  3:07 PM  Result Value Ref Range   Vit D, 25-Hydroxy 29.4 (L) 30.0 - 100.0 ng/mL    Comment: Vitamin D deficiency has been defined by the [place] practice guideline as a level of serum 25-OH vitamin D less than 20 ng/mL (1,2). The Endocrine Society went on to further define vitamin D insufficiency as a level between 21 and 29 ng/mL (2). 1. IOM (Institute of Medicine). 2010. Dietary reference    intakes for calcium and D. [place]: The    [employer]. 2. Holick MF, [place], Bischoff-Ferrari HA, et al.    Evaluation, treatment, and prevention of vitamin D     deficiency: an Endocrine Society clinical practice    guideline. JCEM. 2011 Jul; 96(7):1911-30.   T3     Status: None   Collection Time: 03/30/20  3:07 PM  Result Value Ref Range   T3, Total 99 71 - 180 ng/dL  Urinalysis, Routine w reflex microscopic     Status: Abnormal   Collection Time: 04/02/20  2:22 PM  Result Value Ref Range   Specific Gravity, UA 1.025 1.005 - 1.030   pH, UA 5.5 5.0 - 7.5   Color, UA Yellow Yellow   Appearance Ur Clear Clear   Leukocytes,UA Trace (A) Negative   Protein,UA Negative Negative/Trace   Glucose, UA Negative Negative   Ketones, UA Trace (A) Negative   RBC, UA Negative Negative   Bilirubin, UA Negative Negative   Urobilinogen, Ur 1.0 0.2 - 1.0 mg/dL   Nitrite, UA Negative Negative   Microscopic Examination See below:     Comment: Microscopic was indicated and was performed.  Microscopic Examination     Status: Abnormal   Collection Time: 04/02/20  2:22 PM   Urine  Result Value Ref Range   WBC, UA 0-5 0 - 5 /hpf   RBC 0-2 0 - 2 /hpf   Epithelial Cells (non renal) >10 (A) 0 - 10 /hpf   Casts None seen None seen /lpf   Bacteria, UA Moderate (A) None seen/Few    .Assessment/Plan: 1. Encounter for general adult medical examination with abnormal findings Annual healht maintenance exam today.   2. Irregular heartbeat - EKG 12-Lead occasional PACs and within normal limits. Discussed how caffeine uses and stress and contribute to PACs. Will monitor.   3. Elevated lipoprotein(a) Reviewed labs showing LDL of 113. Discussed adding OTC fish oil daily. Also reviewed diet and lifestyle changes which will help lower cholesterol levels.   4. Dysuria - Urinalysis, Routine w reflex microscopic  General Counseling: Ausha verbalizes understanding of the findings of todays visit and agrees with plan of treatment. I have discussed any further diagnostic evaluation that may be needed or ordered today. We also reviewed her medications today. she has been  encouraged to call the office with any questions or concerns that should arise related to todays visit.    Counseling:  This patient was seen by Leretha Pol FNP Collaboration with Dr Lavera Guise as a part of collaborative care agreement  Orders Placed This Encounter  Procedures  . Microscopic Examination  . Urinalysis, Routine w reflex microscopic  . EKG 12-Lead      Total time spent: 45 Minutes  Time spent includes review of chart, medications, test results, and follow up plan with the patient.     Lavera Guise, MD  Internal Medicine

## 2020-04-03 LAB — MICROSCOPIC EXAMINATION
Casts: NONE SEEN /lpf
Epithelial Cells (non renal): >10 /hpf — AB (ref 0–10)

## 2020-04-03 LAB — URINALYSIS, ROUTINE W REFLEX MICROSCOPIC
Bilirubin, UA: NEGATIVE
Glucose, UA: NEGATIVE
Nitrite, UA: NEGATIVE
Protein,UA: NEGATIVE
RBC, UA: NEGATIVE
Specific Gravity, UA: 1.025 (ref 1.005–1.030)
Urobilinogen, Ur: 1 mg/dL (ref 0.2–1.0)
pH, UA: 5.5 (ref 5.0–7.5)

## 2020-04-07 ENCOUNTER — Encounter: Payer: Self-pay | Admitting: Nurse Practitioner

## 2020-04-07 DIAGNOSIS — I499 Cardiac arrhythmia, unspecified: Secondary | ICD-10-CM | POA: Insufficient documentation

## 2020-04-07 DIAGNOSIS — E7841 Elevated Lipoprotein(a): Secondary | ICD-10-CM | POA: Insufficient documentation

## 2020-04-07 DIAGNOSIS — Z0001 Encounter for general adult medical examination with abnormal findings: Secondary | ICD-10-CM | POA: Insufficient documentation

## 2020-07-14 ENCOUNTER — Ambulatory Visit (INDEPENDENT_AMBULATORY_CARE_PROVIDER_SITE_OTHER): Payer: Managed Care, Other (non HMO) | Admitting: Hospice and Palliative Medicine

## 2020-07-14 ENCOUNTER — Other Ambulatory Visit: Payer: Self-pay

## 2020-07-14 ENCOUNTER — Encounter: Payer: Self-pay | Admitting: Hospice and Palliative Medicine

## 2020-07-14 DIAGNOSIS — R635 Abnormal weight gain: Secondary | ICD-10-CM

## 2020-07-14 DIAGNOSIS — Z6835 Body mass index (BMI) 35.0-35.9, adult: Secondary | ICD-10-CM

## 2020-07-14 DIAGNOSIS — J45909 Unspecified asthma, uncomplicated: Secondary | ICD-10-CM

## 2020-07-14 NOTE — Progress Notes (Signed)
[facility] [place], [place] [address]  Internal MEDICINE  Office Visit Note  Patient Name: Jennifer White  [id number]  [id number]  Date of Service: 07/17/2020  Chief Complaint  Patient presents with  . Follow-up    from needs filled out  . Anemia  . Asthma  . policy update form    received    HPI Patient is here for routine follow-up She is here to review options to help with weight loss assistance For her insurance company we must prepare a plan to assist her with weight loss for the next year--her goal is to be within normal BMI range She would like to avoid medication therapy and to achieve weight loss goal through diet and exericse   Current Medication: Outpatient Encounter Medications as of 07/14/2020  Medication Sig  . albuterol (VENTOLIN HFA) 108 (90 Base) MCG/ACT inhaler Inhale 1 puff into the lungs every 6 (six) hours as needed for wheezing or shortness of breath.  . cholecalciferol (VITAMIN D) 1000 units tablet Take 1,000 Units by mouth daily.  . vitamin C (ASCORBIC ACID) 500 MG tablet Take 500 mg by mouth daily.  . vitamin E (VITAMIN E) 400 UNIT capsule Take 400 Units by mouth daily.   No facility-administered encounter medications on file as of 07/14/2020.    Surgical History: Past Surgical History:  Procedure Laterality Date  . ABDOMINAL HYSTERECTOMY    . CESAREAN SECTION     1993/2002  . CHOLECYSTECTOMY, LAPAROSCOPIC  2004  . COLONOSCOPY  2011   normal  . COLONOSCOPY  03/20/2019  . DILATION AND CURETTAGE OF UTERUS  1988   septic AB/PID/+GC  . LAPAROSCOPIC SUPRACERVICAL HYSTERECTOMY  09/24/2013   and bilateral salpingectomy, minilaparotomy and cystoscopy  . OOPHORECTOMY  10/28/2013   left for ovarian torsion    Medical History: Past Medical History:  Diagnosis Date  . Anemia   . Asthma   . Endometriosis   . Fibroid   . Lactose intolerance   . Vitamin D deficiency     Family History: Family History   Problem Relation Age of Onset  . Hypertension Father   . Lung cancer Father 62  . Atrial fibrillation Father   . Colon cancer Paternal Grandmother 12       had a blockage but unsure if colon cancer or stomach cancer  . Diabetes Paternal Grandmother        amputee  . Hypertension Paternal Grandmother   . Prostate cancer Paternal Grandfather   . Arthritis Mother   . Breast cancer Neg Hx     Social History   Socioeconomic History  . Marital status: Married    Spouse name: Not on file  . Number of children: 2  . Years of education: Not on file  . Highest education level: Not on file  Occupational History  . Occupation: Customer service manager  Tobacco Use  . Smoking status: Never Smoker  . Smokeless tobacco: Never Used  Vaping Use  . Vaping Use: Never used  Substance and Sexual Activity  . Alcohol use: No  . Drug use: No  . Sexual activity: Yes    Partners: Male    Birth control/protection: Surgical    Comment: Hysterectomy  Other Topics Concern  . Not on file  Social History Narrative  . Not on file   Social Determinants of Health   Financial Resource Strain:   . Difficulty of Paying Living Expenses: Not on file  Food Insecurity:   .  Worried About Charity fundraiser in the Last Year: Not on file  . Ran Out of Food in the Last Year: Not on file  Transportation Needs:   . Lack of Transportation (Medical): Not on file  . Lack of Transportation (Non-Medical): Not on file  Physical Activity:   . Days of Exercise per Week: Not on file  . Minutes of Exercise per Session: Not on file  Stress:   . Feeling of Stress : Not on file  Social Connections:   . Frequency of Communication with Friends and Family: Not on file  . Frequency of Social Gatherings with Friends and Family: Not on file  . Attends Religious Services: Not on file  . Active Member of Clubs or Organizations: Not on file  . Attends Archivist Meetings: Not on file  . Marital Status: Not on  file  Intimate Partner Violence:   . Fear of Current or Ex-Partner: Not on file  . Emotionally Abused: Not on file  . Physically Abused: Not on file  . Sexually Abused: Not on file      Review of Systems  Constitutional: Negative for chills, diaphoresis and fatigue.  HENT: Negative for ear pain, postnasal drip and sinus pressure.   Eyes: Negative for photophobia, discharge, redness, itching and visual disturbance.  Respiratory: Negative for cough, shortness of breath and wheezing.   Cardiovascular: Negative for chest pain, palpitations and leg swelling.  Gastrointestinal: Negative for abdominal pain, constipation, diarrhea, nausea and vomiting.  Genitourinary: Negative for dysuria and flank pain.  Musculoskeletal: Negative for arthralgias, back pain, gait problem and neck pain.  Skin: Negative for color change.  Allergic/Immunologic: Negative for environmental allergies and food allergies.  Neurological: Negative for dizziness and headaches.  Hematological: Does not bruise/bleed easily.  Psychiatric/Behavioral: Negative for agitation, behavioral problems (depression) and hallucinations.    Vital Signs: BP 102/78   Pulse 90   Temp 98.1 F (36.7 C)   Resp 16   Ht 5\' 1"  (1.549 m)   Wt 187 lb 9.6 oz (85.1 kg)   SpO2 97%   BMI 35.45 kg/m    Physical Exam Vitals reviewed.  Constitutional:      Appearance: Normal appearance. She is obese.  Cardiovascular:     Rate and Rhythm: Normal rate and regular rhythm.     Pulses: Normal pulses.     Heart sounds: Normal heart sounds.  Pulmonary:     Effort: Pulmonary effort is normal.     Breath sounds: Normal breath sounds.  Musculoskeletal:        General: Normal range of motion.  Skin:    General: Skin is warm.  Neurological:     General: No focal deficit present.     Mental Status: She is alert and oriented to person, place, and time. Mental status is at baseline.  Psychiatric:        Mood and Affect: Mood normal.         Behavior: Behavior normal.        Thought Content: Thought content normal.    Assessment/Plan: 1. BMI 35.0-35.9,adult Assess B12 levles Will update metabolic testing at next visit Goal for next visit is to have complete a food diary as well as 20-30 minutes of exercise at least 3 times per week Goal weight loss for next visit is 5 pounds lost - B12  2. Mild asthma without complication, unspecified whether persistent Symptoms remain well controlled, rarely feels the need to use albuterol inhaler, continue with  routine monitoirng  General Counseling: Lesia verbalizes understanding of the findings of todays visit and agrees with plan of treatment. I have discussed any further diagnostic evaluation that may be needed or ordered today. We also reviewed her medications today. she has been encouraged to call the office with any questions or concerns that should arise related to todays visit.    Orders Placed This Encounter  Procedures  . B12      Time spent: 30 Minutes Time spent includes review of chart, medications, test results and follow-up plan with the patient.  This patient was seen by Theodoro Grist AGNP-C in Collaboration with Dr Lavera Guise as a part of collaborative care agreement     Tanna Furry. Hadia Minier AGNP-C Internal medicine

## 2020-07-17 ENCOUNTER — Encounter: Payer: Self-pay | Admitting: Hospice and Palliative Medicine

## 2020-07-20 ENCOUNTER — Ambulatory Visit: Payer: Managed Care, Other (non HMO) | Admitting: Nurse Practitioner

## 2020-08-12 ENCOUNTER — Ambulatory Visit: Payer: Managed Care, Other (non HMO) | Admitting: Nurse Practitioner

## 2020-12-23 ENCOUNTER — Ambulatory Visit (INDEPENDENT_AMBULATORY_CARE_PROVIDER_SITE_OTHER): Payer: Managed Care, Other (non HMO) | Admitting: Hospice and Palliative Medicine

## 2020-12-23 ENCOUNTER — Other Ambulatory Visit: Payer: Self-pay

## 2020-12-23 ENCOUNTER — Encounter: Payer: Self-pay | Admitting: Hospice and Palliative Medicine

## 2020-12-23 VITALS — BP 118/78 | HR 75 | Temp 97.5°F | Resp 16 | Ht 61.0 in | Wt 187.2 lb

## 2020-12-23 DIAGNOSIS — R0600 Dyspnea, unspecified: Secondary | ICD-10-CM

## 2020-12-23 DIAGNOSIS — U099 Post covid-19 condition, unspecified: Secondary | ICD-10-CM

## 2020-12-23 DIAGNOSIS — J324 Chronic pansinusitis: Secondary | ICD-10-CM | POA: Diagnosis not present

## 2020-12-23 DIAGNOSIS — R002 Palpitations: Secondary | ICD-10-CM | POA: Diagnosis not present

## 2020-12-23 DIAGNOSIS — R0609 Other forms of dyspnea: Secondary | ICD-10-CM | POA: Diagnosis not present

## 2020-12-23 DIAGNOSIS — R5383 Other fatigue: Secondary | ICD-10-CM

## 2020-12-23 MED ORDER — DOXYCYCLINE HYCLATE 100 MG PO TABS: 100.0000 mg | ORAL_TABLET | Freq: Two times a day (BID) | ORAL | 0 refills | Status: DC

## 2020-12-23 NOTE — Progress Notes (Signed)
[facility] [place], [place] [address]  Internal MEDICINE  Office Visit Note  Patient Name: Jennifer White  [id number]  [id number]  Date of Service: 12/24/2020  Chief Complaint  Patient presents with  . Acute Visit    fluttering in chest, headaches constantly, tender on her head, left side, feels short of breath, feels pressure after walking up small hill and a few stairs, after she sits and rests she feels better, both legs feel heavy noticed over the last 2 months, left side of chest feels tender, pt had covid in January, has some soreness on left side, jaw, neck, ear     HPI Pt is here for a sick visit. Personal history of COVID in early January, feels her symptoms started soon after recovering from acute illness and over the last several months have just gotten worse C/o shortness of breath with exertion--primarily notices shortness of breath when climbing stairs at her work, a few times per week will feel a heaviness in her chest and heart fluttering--fluttering will last a few minutes and will resolve without intervention Has been experiencing left sided headaches---left sided sinuses remain congested and tender to touch Also notices a heaviness in her arms and legs at the end of the day All symptoms are new to her post COVID, she has been attempting to deal with them as she thought they would slowly start to improve but she feels they are actually worsening instead of improving  History of asthma, denies wheezing or coughing, shortness of breath related only to climbing flights of stairs which prior to [place] did not cause these symptoms   Current Medication:  Outpatient Encounter Medications as of 12/23/2020  Medication Sig  . albuterol (VENTOLIN HFA) 108 (90 Base) MCG/ACT inhaler Inhale 1 puff into the lungs every 6 (six) hours as needed for wheezing or shortness of breath.  . cholecalciferol (VITAMIN D) 1000 units tablet Take 1,000 Units by mouth  daily.  Marland Kitchen doxycycline (VIBRA-TABS) 100 MG tablet Take 1 tablet (100 mg total) by mouth 2 (two) times daily.  . vitamin C (ASCORBIC ACID) 500 MG tablet Take 500 mg by mouth daily.  . vitamin E 180 MG (400 UNITS) capsule Take 400 Units by mouth daily.   No facility-administered encounter medications on file as of 12/23/2020.      Medical History: Past Medical History:  Diagnosis Date  . Anemia   . Asthma   . Endometriosis   . Fibroid   . Lactose intolerance   . Vitamin D deficiency      Vital Signs: BP 118/78   Pulse 75   Temp (!) 97.5 F (36.4 C)   Resp 16   Ht 5\' 1"  (1.549 m)   Wt 187 lb 3.2 oz (84.9 kg)   SpO2 98%   BMI 35.37 kg/m    Review of Systems  Constitutional: Negative for chills, diaphoresis and fatigue.  HENT: Positive for sinus pressure and sinus pain. Negative for ear pain and postnasal drip.   Eyes: Negative for photophobia, discharge, redness, itching and visual disturbance.  Respiratory: Positive for shortness of breath. Negative for cough and wheezing.   Cardiovascular: Positive for chest pain and palpitations. Negative for leg swelling.  Gastrointestinal: Negative for abdominal pain, constipation, diarrhea, nausea and vomiting.  Genitourinary: Negative for dysuria and flank pain.  Musculoskeletal: Negative for arthralgias, back pain, gait problem and neck pain.  Skin: Negative for color change.  Allergic/Immunologic: Negative for environmental allergies and food allergies.  Neurological: Positive for headaches. Negative for dizziness.  Hematological: Does not bruise/bleed easily.  Psychiatric/Behavioral: Negative for agitation, behavioral problems (depression) and hallucinations.    Physical Exam Vitals reviewed.  Constitutional:      Appearance: Normal appearance. She is obese.  HENT:     Nose: Congestion and rhinorrhea present.  Cardiovascular:     Rate and Rhythm: Normal rate and regular rhythm.     Pulses: Normal pulses.     Heart  sounds: Normal heart sounds.  Pulmonary:     Effort: Pulmonary effort is normal.     Breath sounds: Normal breath sounds.  Abdominal:     General: Abdomen is flat.     Palpations: Abdomen is soft.  Musculoskeletal:        General: Normal range of motion.  Skin:    General: Skin is warm.  Neurological:     General: No focal deficit present.     Mental Status: She is alert and oriented to person, place, and time. Mental status is at baseline.  Psychiatric:        Mood and Affect: Mood normal.        Behavior: Behavior normal.        Thought Content: Thought content normal.        Judgment: Judgment normal.    Assessment/Plan: 1. 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 Palpitations Normal EKG in office today, will send for long term monitoring to capture palpitations Review labs that are possible contributing to symptoms - EKG 12-Lead - CBC w/Diff/Platelet - Comprehensive Metabolic Panel (CMET) - TSH + free T4 - B12 - Vitamin D (25 hydroxy) - LONG TERM MONITOR (3-14 DAYS); Future  3. Dyspnea on exertion Will review echocardiogram for possible underlying cause of DOE - ECHOCARDIOGRAM COMPLETE; Future  4. Chronic pansinusitis Complete course of doxycyline as she recently complete ZPAK after being seen by urgent care--if symptoms persist after echo and CT completed complete appropriate allergy work-up  5. Other fatigue - CBC w/Diff/Platelet - Comprehensive Metabolic Panel (CMET) - Lipid Panel With LDL/HDL Ratio - TSH + free T4 - B12 - Vitamin D (25 hydroxy)  General Counseling: Jakya verbalizes understanding of the findings of todays visit and agrees with plan of treatment. I have discussed any further diagnostic evaluation that may be needed or ordered today. We also reviewed her medications today. she has been encouraged to call the office  with any questions or concerns that should arise related to todays visit.   Orders Placed This Encounter  Procedures  . CT Chest Wo Contrast  . CBC w/Diff/Platelet  . Comprehensive Metabolic Panel (CMET)  . Lipid Panel With LDL/HDL Ratio  . TSH + free T4  . B12  . Vitamin D (25 hydroxy)  . LONG TERM MONITOR (3-14 DAYS)  . EKG 12-Lead  . ECHOCARDIOGRAM COMPLETE    Meds ordered this encounter  Medications  . doxycycline (VIBRA-TABS) 100 MG tablet    Sig: Take 1 tablet (100 mg total) by mouth 2 (two) times daily.    Dispense:  10 tablet    Refill:  0    Time spent: 30 Minutes Time spent includes review of chart, medications, test results and follow-up plan with the patient.  This patient was seen by Theodoro Grist AGNP-C in Collaboration with Dr Lavera Guise as a part of collaborative care agreement.  Tanna Furry  [facility] Internal Medicine

## 2020-12-24 ENCOUNTER — Encounter: Payer: Self-pay | Admitting: Hospice and Palliative Medicine

## 2021-01-12 ENCOUNTER — Ambulatory Visit
Admission: RE | Admit: 2021-01-12 | Discharge: 2021-01-12 | Disposition: A | Discharge status: Home / Self Care | Payer: Managed Care, Other (non HMO) | Source: Ambulatory Visit | Attending: Hospice and Palliative Medicine | Admitting: Hospice and Palliative Medicine

## 2021-01-12 ENCOUNTER — Other Ambulatory Visit: Payer: Self-pay

## 2021-01-12 DIAGNOSIS — U099 Post covid-19 condition, unspecified: Secondary | ICD-10-CM | POA: Insufficient documentation

## 2021-01-12 DIAGNOSIS — R0609 Other forms of dyspnea: Secondary | ICD-10-CM

## 2021-01-13 LAB — COMPREHENSIVE METABOLIC PANEL
ALT: 32 IU/L (ref 0–32)
AST: 20 IU/L (ref 0–40)
Albumin/Globulin Ratio: 1.7 (ref 1.2–2.2)
Albumin: 4.5 g/dL (ref 3.8–4.9)
Alkaline Phosphatase: 103 IU/L (ref 44–121)
BUN/Creatinine Ratio: 22 (ref 9–23)
BUN: 22 mg/dL (ref 6–24)
Bilirubin Total: 0.6 mg/dL (ref 0.0–1.2)
CO2: 23 mmol/L (ref 20–29)
Calcium: 9.5 mg/dL (ref 8.7–10.2)
Chloride: 104 mmol/L (ref 96–106)
Creatinine, Ser: 0.98 mg/dL (ref 0.57–1.00)
Globulin, Total: 2.6 g/dL (ref 1.5–4.5)
Glucose: 91 mg/dL (ref 65–99)
Potassium: 4.3 mmol/L (ref 3.5–5.2)
Sodium: 141 mmol/L (ref 134–144)
Total Protein: 7.1 g/dL (ref 6.0–8.5)
eGFR: 70 mL/min/{1.73_m2} (ref >59)

## 2021-01-13 LAB — CBC WITH DIFFERENTIAL/PLATELET
Basophils Absolute: 0.1 10*3/uL (ref 0.0–0.2)
Basos: 1 %
EOS (ABSOLUTE): 0.1 10*3/uL (ref 0.0–0.4)
Eos: 1 %
Hematocrit: 42.5 % (ref 34.0–46.6)
Hemoglobin: 14.2 g/dL (ref 11.1–15.9)
Immature Grans (Abs): 0 10*3/uL (ref 0.0–0.1)
Immature Granulocytes: 0 %
Lymphocytes Absolute: 2.1 10*3/uL (ref 0.7–3.1)
Lymphs: 36 %
MCH: 29.2 pg (ref 26.6–33.0)
MCHC: 33.4 g/dL (ref 31.5–35.7)
MCV: 87 fL (ref 79–97)
Monocytes Absolute: 0.5 10*3/uL (ref 0.1–0.9)
Monocytes: 9 %
Neutrophils Absolute: 3.1 10*3/uL (ref 1.4–7.0)
Neutrophils: 53 %
Platelets: 199 10*3/uL (ref 150–450)
RBC: 4.87 x10E6/uL (ref 3.77–5.28)
RDW: 13.1 % (ref 11.7–15.4)
WBC: 5.8 10*3/uL (ref 3.4–10.8)

## 2021-01-13 LAB — LIPID PANEL WITH LDL/HDL RATIO
Cholesterol, Total: 200 mg/dL — ABNORMAL HIGH (ref 100–199)
HDL: 57 mg/dL (ref >39)
LDL Chol Calc (NIH): 128 mg/dL — ABNORMAL HIGH (ref 0–99)
LDL/HDL Ratio: 2.2 ratio (ref 0.0–3.2)
Triglycerides: 84 mg/dL (ref 0–149)
VLDL Cholesterol Cal: 15 mg/dL (ref 5–40)

## 2021-01-13 LAB — VITAMIN D 25 HYDROXY (VIT D DEFICIENCY, FRACTURES): Vit D, 25-Hydroxy: 28.7 ng/mL — ABNORMAL LOW (ref 30.0–100.0)

## 2021-01-13 LAB — TSH+FREE T4
Free T4: 1.26 ng/dL (ref 0.82–1.77)
TSH: 3.83 u[IU]/mL (ref 0.450–4.500)

## 2021-01-13 LAB — VITAMIN B12: Vitamin B-12: 653 pg/mL (ref 232–1245)

## 2021-01-13 NOTE — Progress Notes (Signed)
Labs reviewed, will be discussed at next visit.

## 2021-01-13 NOTE — Progress Notes (Signed)
Normal CT, will review at next visit.

## 2021-01-20 ENCOUNTER — Encounter: Payer: Self-pay | Admitting: Hospice and Palliative Medicine

## 2021-01-20 ENCOUNTER — Ambulatory Visit: Payer: Managed Care, Other (non HMO) | Admitting: Hospice and Palliative Medicine

## 2021-01-20 ENCOUNTER — Other Ambulatory Visit: Payer: Self-pay

## 2021-01-20 VITALS — BP 110/80 | HR 72 | Temp 98.1°F | Resp 16 | Ht 61.0 in | Wt 191.0 lb

## 2021-01-20 DIAGNOSIS — Z6836 Body mass index (BMI) 36.0-36.9, adult: Secondary | ICD-10-CM

## 2021-01-20 DIAGNOSIS — R0609 Other forms of dyspnea: Secondary | ICD-10-CM | POA: Diagnosis not present

## 2021-01-20 DIAGNOSIS — U099 Post covid-19 condition, unspecified: Secondary | ICD-10-CM

## 2021-01-20 DIAGNOSIS — E782 Mixed hyperlipidemia: Secondary | ICD-10-CM

## 2021-01-20 NOTE — Progress Notes (Signed)
[facility] [place], [place] [address]  Internal MEDICINE  Office Visit Note  Patient Name: Jennifer White  [id number]  [id number]  Date of Service: 01/20/2021  Chief Complaint  Patient presents with  . Follow-up    Ct results  . Anemia    HPI Patient is here for routine follow-up Reviewed chest CT due to ongoing shortness of breath post COVID IMPRESSION: 1. No acute pulmonary findings or worrisome pulmonary lesions. No evidence of pulmonary fibrosis or bronchiectasis. 2. No mediastinal or hilar mass or lymphadenopathy.  Working full-time and going to school full time Eating habits have been poor, not getting in [place] activity and sleep has been poor in order to get all of her work done Scheduled for her echo in June  Since our last visit she is starting to feel better Reveiwed labs: Abnormal lipid panel all others stable  Current Medication: Outpatient Encounter Medications as of 01/20/2021  Medication Sig  . cholecalciferol (VITAMIN D) 1000 units tablet Take 1,000 Units by mouth daily.  Marland Kitchen doxycycline (VIBRA-TABS) 100 MG tablet Take 1 tablet (100 mg total) by mouth 2 (two) times daily.  . vitamin C (ASCORBIC ACID) 500 MG tablet Take 500 mg by mouth daily.  . vitamin E 180 MG (400 UNITS) capsule Take 400 Units by mouth daily.  Marland Kitchen albuterol (VENTOLIN HFA) 108 (90 Base) MCG/ACT inhaler Inhale 1 puff into the lungs every 6 (six) hours as needed for wheezing or shortness of breath. (Patient not taking: Reported on 01/20/2021)   No facility-administered encounter medications on file as of 01/20/2021.    Surgical History: Past Surgical History:  Procedure Laterality Date  . ABDOMINAL HYSTERECTOMY    . CESAREAN SECTION     1993/2002  . CHOLECYSTECTOMY, LAPAROSCOPIC  2004  . COLONOSCOPY  2011   normal  . COLONOSCOPY  03/20/2019  . DILATION AND CURETTAGE OF UTERUS  1988   septic AB/PID/+GC  . LAPAROSCOPIC SUPRACERVICAL HYSTERECTOMY   09/24/2013   and bilateral salpingectomy, minilaparotomy and cystoscopy  . OOPHORECTOMY  10/28/2013   left for ovarian torsion    Medical History: Past Medical History:  Diagnosis Date  . Anemia   . Asthma   . Endometriosis   . Fibroid   . Lactose intolerance   . Vitamin D deficiency     Family History: Family History  Problem Relation Age of Onset  . Hypertension Father   . Lung cancer Father 59  . Atrial fibrillation Father   . Colon cancer Paternal Grandmother 49       had a blockage but unsure if colon cancer or stomach cancer  . Diabetes Paternal Grandmother        amputee  . Hypertension Paternal Grandmother   . Prostate cancer Paternal Grandfather   . Arthritis Mother   . Breast cancer Neg Hx     Social History   Socioeconomic History  . Marital status: Married    Spouse name: Not on file  . Number of children: 2  . Years of education: Not on file  . Highest education level: Not on file  Occupational History  . Occupation: Customer service manager  Tobacco Use  . Smoking status: Never Smoker  . Smokeless tobacco: Never Used  Vaping Use  . Vaping Use: Never used  Substance and Sexual Activity  . Alcohol use: No  . Drug use: No  . Sexual activity: Yes    Partners: Male    Birth control/protection: Surgical  Comment: Hysterectomy  Other Topics Concern  . Not on file  Social History Narrative  . Not on file   Social Determinants of Health   Financial Resource Strain: Not on file  Food Insecurity: Not on file  Transportation Needs: Not on file  Physical Activity: Not on file  Stress: Not on file  Social Connections: Not on file  Intimate Partner Violence: Not on file      Review of Systems  Constitutional: Negative for chills, diaphoresis and fatigue.  HENT: Negative for ear pain, postnasal drip and sinus pressure.   Eyes: Negative for photophobia, discharge, redness, itching and visual disturbance.  Respiratory: Negative for cough,  shortness of breath and wheezing.   Cardiovascular: Negative for chest pain, palpitations and leg swelling.  Gastrointestinal: Negative for abdominal pain, constipation, diarrhea, nausea and vomiting.  Genitourinary: Negative for dysuria and flank pain.  Musculoskeletal: Negative for arthralgias, back pain, gait problem and neck pain.  Skin: Negative for color change.  Allergic/Immunologic: Negative for environmental allergies and food allergies.  Neurological: Negative for dizziness and headaches.  Hematological: Does not bruise/bleed easily.  Psychiatric/Behavioral: Negative for agitation, behavioral problems (depression) and hallucinations.    Vital Signs: BP 110/80   Pulse 72   Temp 98.1 F (36.7 C)   Resp 16   Ht 5\' 1"  (1.549 m)   Wt 191 lb (86.6 kg)   SpO2 99%   BMI 36.09 kg/m    Physical Exam Vitals reviewed.  Constitutional:      Appearance: Normal appearance. She is obese.  Cardiovascular:     Rate and Rhythm: Normal rate and regular rhythm.     Pulses: Normal pulses.     Heart sounds: Normal heart sounds.  Pulmonary:     Effort: Pulmonary effort is normal.     Breath sounds: Normal breath sounds.  Musculoskeletal:        General: Normal range of motion.     Cervical back: Normal range of motion.  Skin:    General: Skin is warm.  Neurological:     General: No focal deficit present.     Mental Status: She is alert and oriented to person, place, and time. Mental status is at baseline.  Psychiatric:        Mood and Affect: Mood normal.        Behavior: Behavior normal.        Thought Content: Thought content normal.        Judgment: Judgment normal.   Assessment/Plan: 1. Mixed hyperlipidemia Discussed healthy lifestyle changes such as diet and increasing physical activity levels that can help improve levels Consider further vascular studies  2. BMI 36.0-36.9,adult Obesity Counseling: Risk Assessment: An assessment of behavioral risk factors was made  today and includes lack of exercise sedentary lifestyle, lack of portion control and poor dietary habits.  Risk Modification Advice: She was counseled on portion control guidelines. Restricting daily caloric intake to 1800. The detrimental long term effects of obesity on her health and ongoing poor compliance was also discussed with the patient.  3. 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 I have discussed any further diagnostic evaluation that may be needed or ordered today. We also reviewed her medications today. she has been encouraged to call the office with  any questions or concerns that should arise related to todays visit.   Time spent: 30 Minutes   This patient was seen by Theodoro Grist AGNP-C in Collaboration with Dr Lavera Guise as a part of collaborative care agreement     Tanna Furry. Laverne Hursey AGNP-C Internal medicine

## 2021-01-26 ENCOUNTER — Encounter: Payer: Self-pay | Admitting: Hospice and Palliative Medicine

## 2021-03-02 ENCOUNTER — Other Ambulatory Visit: Payer: Managed Care, Other (non HMO)

## 2021-03-25 ENCOUNTER — Other Ambulatory Visit (HOSPITAL_COMMUNITY)
Admission: RE | Admit: 2021-03-25 | Discharge: 2021-03-25 | Disposition: A | Discharge status: Home / Self Care | Payer: Managed Care, Other (non HMO) | Source: Ambulatory Visit | Attending: Obstetrics and Gynecology | Admitting: Obstetrics and Gynecology

## 2021-03-25 ENCOUNTER — Encounter: Payer: Self-pay | Admitting: Obstetrics and Gynecology

## 2021-03-25 ENCOUNTER — Other Ambulatory Visit: Payer: Self-pay

## 2021-03-25 ENCOUNTER — Ambulatory Visit (INDEPENDENT_AMBULATORY_CARE_PROVIDER_SITE_OTHER): Payer: Managed Care, Other (non HMO) | Admitting: Obstetrics and Gynecology

## 2021-03-25 VITALS — BP 120/72 | Ht 61.0 in | Wt 182.2 lb

## 2021-03-25 DIAGNOSIS — Z124 Encounter for screening for malignant neoplasm of cervix: Secondary | ICD-10-CM | POA: Diagnosis present

## 2021-03-25 DIAGNOSIS — Z01419 Encounter for gynecological examination (general) (routine) without abnormal findings: Secondary | ICD-10-CM | POA: Diagnosis not present

## 2021-03-25 DIAGNOSIS — Z1231 Encounter for screening mammogram for malignant neoplasm of breast: Secondary | ICD-10-CM | POA: Diagnosis not present

## 2021-03-25 DIAGNOSIS — Z113 Encounter for screening for infections with a predominantly sexual mode of transmission: Secondary | ICD-10-CM | POA: Diagnosis not present

## 2021-03-25 NOTE — Progress Notes (Signed)
Gynecology Annual Exam  PCP: Lavera Guise, MD  Chief Complaint:  Chief Complaint  Patient presents with   Routine Prenatal Visit    History of Present Illness: Patient is a 50 y.o. [id number] presents for annual exam. The patient has no complaints today.   LMP: No LMP recorded. Patient has had a hysterectomy.  The patient is sexually active. She denies dyspareunia.     The patient does perform self breast exams.  There is no notable family history of breast or ovarian cancer in her family.  The patient has regular exercise: walking and bike riding 3 days a week.  The patient denies current symptoms of depression.   PHQ-9: 0 GAD-7: 1   Review of Systems: ROS  Past Medical History:  Past Medical History:  Diagnosis Date   Anemia    Asthma    Endometriosis    Fibroid    Lactose intolerance    Vitamin D deficiency     Past Surgical History:  Past Surgical History:  Procedure Laterality Date   ABDOMINAL HYSTERECTOMY     CESAREAN SECTION     1993/2002   CHOLECYSTECTOMY, LAPAROSCOPIC  2004   COLONOSCOPY  2011   normal   COLONOSCOPY  03/20/2019   DILATION AND CURETTAGE OF UTERUS  1988   septic AB/PID/+GC   LAPAROSCOPIC SUPRACERVICAL HYSTERECTOMY  09/24/2013   and bilateral salpingectomy, minilaparotomy and cystoscopy   OOPHORECTOMY  10/28/2013   left for ovarian torsion    Gynecologic History:  No LMP recorded. Patient has had a hysterectomy. Last Pap: Results were: 2021 NIL Last mammogram: 2021 Results were: BI-RAD I  Obstetric History: [id number]  Family History:  Family History  Problem Relation Age of Onset   Hypertension Father    Lung cancer Father 70   Atrial fibrillation Father    Colon cancer Paternal Grandmother 76       had a blockage but unsure if colon cancer or stomach cancer   Diabetes Paternal Grandmother        amputee   Hypertension Paternal Grandmother    Prostate cancer Paternal Grandfather    Arthritis Mother    Breast cancer  Neg Hx     Social History:  Social History   Socioeconomic History   Marital status: Married    Spouse name: Not on file   Number of children: 2   Years of education: Not on file   Highest education level: Not on file  Occupational History   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  Tobacco Use   Smoking status: Never   Smokeless tobacco: Never  Vaping Use   Vaping Use: Never used  Substance and Sexual Activity   Alcohol use: No   Drug use: No   Sexual activity: Yes    Partners: Male    Birth control/protection: Surgical    Comment: Hysterectomy  Other Topics Concern   Not on file  Social History Narrative   Not on file   Social Determinants of Health   Financial Resource Strain: Not on file  Food Insecurity: Not on file  Transportation Needs: Not on file  Physical Activity: Not on file  Stress: Not on file  Social Connections: Not on file  Intimate Partner Violence: Not on file    Allergies:  No Known Allergies  Medications: Prior to Admission medications   Medication Sig Start Date End Date Taking? Authorizing Provider  cholecalciferol (VITAMIN D) 1000 units tablet Take 1,000 Units by mouth daily.   Yes  [provider]  vitamin C (ASCORBIC ACID) 500 MG tablet Take 500 mg by mouth daily.   Yes [provider]  vitamin E 180 MG (400 UNITS) capsule Take 400 Units by mouth daily.   Yes [provider]  albuterol (VENTOLIN HFA) 108 (90 Base) MCG/ACT inhaler Inhale 1 puff into the lungs every 6 (six) hours as needed for wheezing or shortness of breath. Patient not taking: Reported on 01/20/2021 01/24/19   Ronnell Freshwater, NP  doxycycline (VIBRA-TABS) 100 MG tablet Take 1 tablet (100 mg total) by mouth 2 (two) times daily. Patient not taking: Reported on 03/25/2021 12/23/20   Luiz Ochoa, NP    Physical Exam Vitals: Blood pressure 120/72, height 5\' 1"  (1.549 m), weight 182 lb 3.2 oz (82.6 kg).  Physical Exam Constitutional:       Appearance: She is well-developed.  Genitourinary:     Genitourinary Comments: External: Normal appearing vulva. No lesions noted.  Speculum examination: Normal appearing cervix. No blood in the vaginal vault. no discharge.   Bimanual examination: Uterus absent  No CMT. No adnexal masses. No adnexal tenderness. Pelvis not fixed.  Breast Exam: breast equal without skin changes, nipple discharge, breast lump or enlarged lymph nodes   HENT:     Head: Normocephalic and atraumatic.  Neck:     Thyroid: No thyromegaly.  Cardiovascular:     Rate and Rhythm: Normal rate and regular rhythm.     Heart sounds: Normal heart sounds.  Pulmonary:     Effort: Pulmonary effort is normal.     Breath sounds: Normal breath sounds.  Abdominal:     General: Bowel sounds are normal. There is no distension.     Palpations: Abdomen is soft. There is no mass.  Musculoskeletal:     Cervical back: Neck supple.  Neurological:     Mental Status: She is alert and oriented to person, place, and time.  Skin:    General: Skin is warm and dry.  Psychiatric:        Behavior: Behavior normal.        Thought Content: Thought content normal.        Judgment: Judgment normal.  Vitals reviewed.     Female chaperone present for pelvic and breast  portions of the physical exam  Assessment: 50 y.o. [id number] routine annual exam  Plan: Problem List Items Addressed This Visit   None Visit Diagnoses     Encounter for gynecological examination    -  Primary   Screening for cervical cancer       Relevant Orders   Cytology - PAP   Breast cancer screening by mammogram       Relevant Orders   MM 3D SCREEN BREAST BILATERAL       1) Mammogram - recommend yearly screening mammogram.  Mammogram Was ordered today  2) STI screening was offered and declined  3) ASCCP guidelines and rational discussed.  Desires pap today.  4) Colonoscopy -- performed in 2020 With [facility]  5) Routine healthcare maintenance  including cholesterol, diabetes screening discussed managed by PCP  6) Osteoporosis screening - no increased risk factors.  Adrian Prows MD, [facility], [facility] 03/25/2021 2:11 PM

## 2021-03-25 NOTE — Patient Instructions (Signed)
Institute of [employer] Recommended Dietary Allowances for Calcium and Vitamin D  Age (yr) Calcium Recommended Dietary Allowance (mg/day) Vitamin D Recommended Dietary Allowance (international units/day)  9-18 1,300 600  19-50 1,000 600  51-70 1,200 600  71 and older 1,200 800  Data from [employer]. Dietary reference intakes: calcium, vitamin D. [place], [place]: National Academies Press; 2011.   Exercising to Stay Healthy To become healthy and stay healthy, it is recommended that you do moderate-intensity and vigorous-intensity exercise. You can tell that you are exercising at a moderate intensity if your heart starts beating faster and you start breathing faster but can still hold a conversation. You can tell that you are exercising at a vigorous intensity if you are breathing much harder andfaster and cannot hold a conversation while exercising. Exercising regularly is important. It has many health benefits, such as: Improving overall fitness, flexibility, and endurance. Increasing bone density. Helping with weight control. Decreasing body fat. Increasing muscle strength. Reducing stress and tension. Improving overall health. How often should I exercise? Choose an activity that you enjoy, and set realistic goals. Your health careprovider can help you make an activity plan that works for you. Exercise regularly as told by your health care provider. This may include: Doing strength training two times a week, such as: Lifting weights. Using resistance bands. Push-ups. Sit-ups. Yoga. Doing a certain intensity of exercise for a given amount of time. Choose from these options: A total of 150 minutes of moderate-intensity exercise every week. A total of 75 minutes of vigorous-intensity exercise every week. A mix of moderate-intensity and vigorous-intensity exercise every week. Children, pregnant women, people who have not exercised regularly, people who are overweight, and  older adults may need to talk with a health care provider about what activities are safe to do. If you have a medical condition, be sureto talk with your health care provider before you start a new exercise program. What are some exercise ideas? Moderate-intensity exercise ideas include: Walking 1 mile (1.6 km) in about 15 minutes. Biking. Hiking. Golfing. Dancing. Water aerobics. Vigorous-intensity exercise ideas include: Walking 4.5 miles (7.2 km) or more in about 1 hour. Jogging or running 5 miles (8 km) in about 1 hour. Biking 10 miles (16.1 km) or more in about 1 hour. Lap swimming. Roller-skating or in-line skating. Cross-country skiing. Vigorous competitive sports, such as football, basketball, and soccer. Jumping rope. Aerobic dancing. What are some everyday activities that can help me to get exercise? Yard work, such as: Pushing a lawn mower. Raking and bagging leaves. Washing your car. Pushing a stroller. Shoveling snow. Gardening. Washing windows or floors. How can I be more active in my day-to-day activities? Use stairs instead of an elevator. Take a walk during your lunch break. If you drive, park your car farther away from your work or school. If you take public transportation, get off one stop early and walk the rest of the way. Stand up or walk around during all of your indoor phone calls. Get up, stretch, and walk around every 30 minutes throughout the day. Enjoy exercise with a friend. Support to continue exercising will help you keep a regular routine of activity. What guidelines can I follow while exercising? Before you start a new exercise program, talk with your health care provider. Do not exercise so much that you hurt yourself, feel dizzy, or get very short of breath. Wear comfortable clothes and wear shoes with good support. Drink plenty of water while you exercise to prevent   dehydration or heat stroke. Work out until your breathing and your  heartbeat get faster. Where to find more information [place] Department of Health and Human Services: www.hhs.gov Centers for Disease Control and Prevention (CDC): www.cdc.gov Summary Exercising regularly is important. It will improve your overall fitness, flexibility, and endurance. Regular exercise also will improve your overall health. It can help you control your weight, reduce stress, and improve your bone density. Do not exercise so much that you hurt yourself, feel dizzy, or get very short of breath. Before you start a new exercise program, talk with your health care provider. This information is not intended to replace advice given to you by your health care provider. Make sure you discuss any questions you have with your healthcare provider. Document Revised: 08/27/2020 Document Reviewed: 08/27/2020 Elsevier Patient Education  2022 Else[employer] Inc. Budget-Friendly Healthy Eating There are many ways to save money at the grocery store and continue to eat healthy. You can be successful if you: Plan meals according to your budget. Make a grocery list and only purchase food according to your grocery list. Prepare food yourself at home. What are tips for following this plan? Reading food labels Compare food labels between brand name foods and the store brand. Often the nutritional value is the same, but the store brand is lower cost. Look for products that do not have added sugar, fat, or salt (sodium). These often cost the same but are healthier for you. Products may be labeled as: Sugar-free. Nonfat. Low-fat. Sodium-free. Low-sodium. Look for lean ground beef labeled as at least 92% lean and 8% fat. Shopping  Buy only the items on your grocery list and go only to the areas of the store that have the items on your list. Use coupons only for foods and brands you normally buy. Avoid buying items you wouldn't normally buy simply because they are on sale. Check online and in newspapers for  weekly deals. Buy healthy items from the bulk bins when available, such as herbs, spices, flour, pasta, nuts, and dried fruit. Buy fruits and vegetables that are in season. Prices are usually lower on in-season produce. Look at the unit price on the price tag. Use it to compare different brands and sizes to find out which item is the best deal. Choose healthy items that are often low-cost, such as carrots, potatoes, apples, bananas, and oranges. Dried or canned beans are a low-cost protein source. Buy in bulk and freeze extra food. Items you can buy in bulk include meats, fish, poultry, frozen fruits, and frozen vegetables. Avoid buying "ready-to-eat" foods, such as pre-cut fruits and vegetables and pre-made salads. If possible, shop around to discover where you can find the best prices. Consider other retailers such as dollar stores, larger wholesale stores, local fruit and vegetable stands, and farmers markets. Do not shop when you are hungry. If you shop while hungry, it may be hard to stick to your list and budget. Resist impulse buying. Use your grocery list as your official plan for the week. Buy a variety of vegetables and fruits by purchasing fresh, frozen, and canned items. Look at the top and bottom shelves for deals. Foods at eye level (eye level of an adult or child) are usually more expensive. Be efficient with your time when shopping. The more time you spend at the store, the more money you are likely to spend. To save money when choosing more expensive foods like meats and dairy: Choose cheaper cuts of meat, such as bone-in   chicken thighs and drumsticks instead of skinless and boneless chicken. When you are ready to prepare the chicken, you can remove the skin yourself to make it healthier. Choose lean meats like chicken or turkey instead of beef. Choose canned seafood, such as tuna, salmon, or sardines. Buy eggs as a low-cost source of protein. Buy dried beans and peas, such as  lentils, split peas, or kidney beans instead of meats. Dried beans and peas are a good alternative source of protein. Buy the larger tubs of yogurt instead of individual-sized containers. Choose water instead of sodas and other sweetened beverages. Avoid buying chips, cookies, and other "junk food." These items are usually expensive and not healthy.  Cooking Make extra food and freeze the extras in meal-sized containers or in individual portions for fast meals and snacks. Pre-cook on days when you have extra time to prepare meals in advance. You can keep these meals in the fridge or freezer and reheat for a quick meal. When you come home from the grocery store, wash, peel, and cut fruits and vegetables so they are ready to use and eat. This will help reduce food waste. Meal planning Do not eat out or get fast food. Prepare food at home. Make a grocery list and make sure to bring it with you to the store. If you have a smart phone, you could use your phone to create your shopping list. Plan meals and snacks according to a grocery list and budget you create. Use leftovers in your meal plan for the week. Look for recipes where you can cook once and make enough food for two meals. Prepare budget-friendly types of meals like stews, casseroles, and stir-fry dishes. Try some meatless meals or try "no cook" meals like salads. Make sure that half your plate is filled with fruits or vegetables. Choose from fresh, frozen, or canned fruits and vegetables. If eating canned, remember to rinse them before eating. This will remove any excess salt added for packaging. Summary Eating healthy on a budget is possible if you plan your meals according to your budget, purchase according to your budget and grocery list, and prepare food yourself. Tips for buying more food on a limited budget include buying generic brands, using coupons only for foods you normally buy, and buying healthy items from the bulk bins when  available. Tips for buying cheaper food to replace expensive food include choosing cheaper, lean cuts of meat, and buying dried beans and peas. This information is not intended to replace advice given to you by your health care provider. Make sure you discuss any questions you have with your healthcare provider. Document Revised: 06/24/2020 Document Reviewed: 06/24/2020 Elsevier Patient Education  2022 Elsevier Inc. Bone Health Bones protect organs, store calcium, anchor muscles, and support the whole body. Keeping your bones strong is important, especially as you get older. Youcan take actions to help keep your bones strong and healthy. Why is keeping my bones healthy important?  Keeping your bones healthy is important because your body constantly replaces bone cells. Cells get old, and new cells take their place. As we age, we lose bone cells because the body may not be able to make enough new cells to replace the old cells. The amount of bone cells and bone tissue you have is referred toas bone mass. The higher your bone mass, the stronger your bones. The aging process leads to an overall loss of bone mass in the body, which can increase the likelihood of: Joint pain   and stiffness. Broken bones. A condition in which the bones become weak and brittle (osteoporosis). A large decline in bone mass occurs in older adults. In women, it occurs aboutthe time of menopause. What actions can I take to keep my bones healthy? Good health habits are important for maintaining healthy bones. This includes eating nutritious foods and exercising regularly. To have healthy bones, you need to get enough of the right minerals and vitamins. Most nutrition experts recommend getting these nutrients from the foods that you eat. In some cases, taking supplements may also be recommended. Doing certain types of exercise isalso important for bone health. What are the nutritional recommendations for healthy bones?  Eating  a well-balanced diet with plenty of calcium and vitamin D will help to protect your bones. Nutritional recommendations vary from person to person. Ask your health care provider what is healthy for you. Here are some generalguidelines. Get enough calcium Calcium is the most important (essential) mineral for bone health. Most people can get enough calcium from their diet, but supplements may be recommended for people who are at risk for osteoporosis. Good sources of calcium include: Dairy products, such as low-fat or nonfat milk, cheese, and yogurt. Dark green leafy vegetables, such as bok choy and broccoli. Calcium-fortified foods, such as orange juice, cereal, bread, soy beverages, and tofu products. Nuts, such as almonds. Follow these recommended amounts for daily calcium intake: Children, age 1-3: 700 mg. Children, age 4-8: 1,000 mg. Children, age 9-13: 1,300 mg. Teens, age 14-18: 1,300 mg. Adults, age 19-50: 1,000 mg. Adults, age 51-70: Men: 1,000 mg. Women: 1,200 mg. Adults, age 71 or older: 1,200 mg. Pregnant and breastfeeding females: Teens: 1,300 mg. Adults: 1,000 mg. Get enough vitamin D Vitamin D is the most essential vitamin for bone health. It helps the body absorb calcium. Sunlight stimulates the skin to make vitamin D, so be sure to get enough sunlight. If you live in a cold climate or you do not get outside often, your health care provider may recommend that you take vitamin D supplements. Good sources of vitamin D in your diet include: Egg yolks. Saltwater fish. Milk and cereal fortified with vitamin D. Follow these recommended amounts for daily vitamin D intake: Children and teens, age 1-18: 600 international units. Adults, age 50 or younger: 400-800 international units. Adults, age 51 or older: 800-1,000 international units. Get other important nutrients Other nutrients that are important for bone health include: Phosphorus. This mineral is found in meat, poultry,  dairy foods, nuts, and legumes. The recommended daily intake for adult men and adult women is 700 mg. Magnesium. This mineral is found in seeds, nuts, dark green vegetables, and legumes. The recommended daily intake for adult men is 400-420 mg. For adult women, it is 310-320 mg. Vitamin K. This vitamin is found in green leafy vegetables. The recommended daily intake is 120 mg for adult men and 90 mg for adult women. What type of physical activity is best for building and maintaining healthybones? Weight-bearing and strength-building activities are important for building and maintaining healthy bones. Weight-bearing activities cause muscles and bones to work against gravity. Strength-building activities increase the strength of the muscles that support bones. Weight-bearing and muscle-building activities include: Walking and hiking. Jogging and running. Dancing. Gym exercises. Lifting weights. Tennis and racquetball. Climbing stairs. Aerobics. Adults should get at least 30 minutes of moderate physical activity on most days. Children should get at least 60 minutes of moderate physical activity onmost days. Ask your health care   provider what type of exercise is best for you. How can I find out if my bone mass is low? Bone mass can be measured with an X-ray test called a bone mineral density (BMD) test. This test is recommended for all women who are age 65 or older. It may also be recommended for: Men who are age 70 or older. People who are at risk for osteoporosis because of: Having bones that break easily. Having a long-term disease that weakens bones, such as kidney disease or rheumatoid arthritis. Having menopause earlier than normal. Taking medicine that weakens bones, such as steroids, thyroid hormones, or hormone treatment for breast cancer or prostate cancer. Smoking. Drinking three or more alcoholic drinks a day. If you find that you have a low bone mass, you may be able to  preventosteoporosis or further bone loss by changing your diet and lifestyle. Where can I find more information? For more information, check out the following websites: National Osteoporosis Foundation: www.nof.org/patients National Institutes of Health: www.bones.nih.gov International Osteoporosis Foundation: www.iofbonehealth.org Summary The aging process leads to an overall loss of bone mass in the body, which can increase the likelihood of broken bones and osteoporosis. Eating a well-balanced diet with plenty of calcium and vitamin D will help to protect your bones. Weight-bearing and strength-building activities are also important for building and maintaining strong bones. Bone mass can be measured with an X-ray test called a bone mineral density (BMD) test. This information is not intended to replace advice given to you by your health care provider. Make sure you discuss any questions you have with your healthcare provider. Document Revised: 10/08/2017 Document Reviewed: 10/08/2017 Elsevier Patient Education  2022 Elsevier Inc.  

## 2021-04-01 LAB — CYTOLOGY - PAP
Chlamydia: NEGATIVE
Comment: NEGATIVE
Comment: NEGATIVE
Comment: NEGATIVE
Comment: NORMAL
Diagnosis: NEGATIVE
High risk HPV: NEGATIVE
Neisseria Gonorrhea: NEGATIVE
Trichomonas: NEGATIVE

## 2021-04-05 ENCOUNTER — Encounter: Payer: Managed Care, Other (non HMO) | Admitting: Internal Medicine

## 2021-04-19 ENCOUNTER — Telehealth: Payer: Self-pay

## 2021-04-19 NOTE — Telephone Encounter (Signed)
Pt calling to see if orders sent/placed for mammogram; hasn't heard from anyone yet.  [phone]

## 2021-04-20 ENCOUNTER — Other Ambulatory Visit: Payer: Self-pay | Admitting: Obstetrics and Gynecology

## 2021-04-20 DIAGNOSIS — Z1231 Encounter for screening mammogram for malignant neoplasm of breast: Secondary | ICD-10-CM

## 2021-04-20 DIAGNOSIS — N644 Mastodynia: Secondary | ICD-10-CM

## 2021-04-20 NOTE — Telephone Encounter (Signed)
I don't know why it would of been cancelled. I placed the order again. Please let patient know

## 2021-04-20 NOTE — Telephone Encounter (Signed)
Pt aware new order is in; pt states she tried to schedule mammogram when she got the letter telling her it was time; in conversation pt mentions she was have bilateral burning pain that comes and goes; they would not schedule screening mamm b/c of this conversation; pt was told she would need to see provider first; adv pt she has seen provider on 03/25/21 and this is the order that was placed.  Pt to call and schedule; will call back if has problems.

## 2021-04-20 NOTE — Telephone Encounter (Signed)
Pt calling back; tried to schedule mammogram; Jennifer White at [facility] said they needed a dx mamm and u/s ordered d/t pt's complaint of the bilateral burning that comes and goes; states Radiologist won't allow them to do just a screening mammogram on someone with complaints.   Please place new orders.

## 2021-04-20 NOTE — Progress Notes (Signed)
Breast imaging ordered.

## 2021-04-20 NOTE — Telephone Encounter (Signed)
I placed orders, hopefully this solved the issue- thanks for your follow up attention

## 2021-04-25 ENCOUNTER — Other Ambulatory Visit: Payer: Self-pay

## 2021-04-25 ENCOUNTER — Ambulatory Visit
Admission: RE | Admit: 2021-04-25 | Discharge: 2021-04-25 | Disposition: A | Discharge status: Home / Self Care | Payer: Managed Care, Other (non HMO) | Source: Ambulatory Visit | Attending: Obstetrics and Gynecology | Admitting: Obstetrics and Gynecology

## 2021-04-25 DIAGNOSIS — N644 Mastodynia: Secondary | ICD-10-CM | POA: Insufficient documentation

## 2021-04-25 DIAGNOSIS — Z1231 Encounter for screening mammogram for malignant neoplasm of breast: Secondary | ICD-10-CM | POA: Diagnosis present

## 2021-04-26 ENCOUNTER — Encounter: Payer: Managed Care, Other (non HMO) | Admitting: Nurse Practitioner

## 2021-06-10 ENCOUNTER — Other Ambulatory Visit: Payer: Self-pay

## 2021-06-10 ENCOUNTER — Encounter: Payer: Self-pay | Admitting: Nurse Practitioner

## 2021-06-10 ENCOUNTER — Ambulatory Visit (INDEPENDENT_AMBULATORY_CARE_PROVIDER_SITE_OTHER): Payer: Managed Care, Other (non HMO) | Admitting: Nurse Practitioner

## 2021-06-10 VITALS — BP 110/80 | HR 71 | Temp 98.0°F | Resp 16 | Ht 61.0 in | Wt 184.0 lb

## 2021-06-10 DIAGNOSIS — E782 Mixed hyperlipidemia: Secondary | ICD-10-CM | POA: Diagnosis not present

## 2021-06-10 DIAGNOSIS — Z0001 Encounter for general adult medical examination with abnormal findings: Secondary | ICD-10-CM | POA: Diagnosis not present

## 2021-06-10 DIAGNOSIS — R300 Dysuria: Secondary | ICD-10-CM

## 2021-06-10 DIAGNOSIS — Z6834 Body mass index (BMI) 34.0-34.9, adult: Secondary | ICD-10-CM | POA: Diagnosis not present

## 2021-06-10 DIAGNOSIS — E559 Vitamin D deficiency, unspecified: Secondary | ICD-10-CM

## 2021-06-10 NOTE — Progress Notes (Signed)
[facility] [place], [place] [address]  Internal MEDICINE  Office Visit Note  Patient Name: Jennifer White  [id number]  [id number]  Date of Service: 06/10/2021  Chief Complaint  Patient presents with   Annual Exam   Anemia   Asthma    HPI Jennifer White presents for an annual well visit and physical exam. she has a history of anemia, asthma, uterine fibroid, partial hysterectomy and 2 C-sections. She has received 2 doses of the Covid vaccine. She lives at home with husband and 11 yo daughter. She works full time at [employer]. She had her mammogram done in august 2022, colonoscopy was done in 2020, due in 2030. She had a normal pap in July 2022. Her BMD screening was done in 2016 and she will have it repeated next year with mammogram.  She had a biometric physical done at work and is working with a Economist provided by her insurance and is getting help with working on her diet. She reports that she does not eat as often as she should. She denies any pain today, She has no other questions or concerns.     Current Medication: Outpatient Encounter Medications as of 06/10/2021  Medication Sig   albuterol (VENTOLIN HFA) 108 (90 Base) MCG/ACT inhaler Inhale 1 puff into the lungs every 6 (six) hours as needed for wheezing or shortness of breath.   cholecalciferol (VITAMIN D) 1000 units tablet Take 1,000 Units by mouth daily.   vitamin C (ASCORBIC ACID) 500 MG tablet Take 500 mg by mouth daily.   vitamin E 180 MG (400 UNITS) capsule Take 400 Units by mouth daily.   [DISCONTINUED] doxycycline (VIBRA-TABS) 100 MG tablet Take 1 tablet (100 mg total) by mouth 2 (two) times daily. (Patient not taking: Reported on 03/25/2021)   No facility-administered encounter medications on file as of 06/10/2021.    Surgical History: Past Surgical History:  Procedure Laterality Date   ABDOMINAL HYSTERECTOMY     CESAREAN SECTION     1993/2002   CHOLECYSTECTOMY,  LAPAROSCOPIC  2004   COLONOSCOPY  2011   normal   COLONOSCOPY  03/20/2019   DILATION AND CURETTAGE OF UTERUS  1988   septic AB/PID/+GC   LAPAROSCOPIC SUPRACERVICAL HYSTERECTOMY  09/24/2013   and bilateral salpingectomy, minilaparotomy and cystoscopy   OOPHORECTOMY  10/28/2013   left for ovarian torsion    Medical History: Past Medical History:  Diagnosis Date   Anemia    Asthma    Endometriosis    Fibroid    Lactose intolerance    Vitamin D deficiency     Family History: Family History  Problem Relation Age of Onset   Hypertension Father    Lung cancer Father 78   Atrial fibrillation Father    Colon cancer Paternal Grandmother 76       had a blockage but unsure if colon cancer or stomach cancer   Diabetes Paternal Grandmother        amputee   Hypertension Paternal Grandmother    Prostate cancer Paternal Grandfather    Arthritis Mother    Breast cancer Neg Hx     Social History   Socioeconomic History   Marital status: Married    Spouse name: Not on file   Number of children: 2   Years of education: Not on file   Highest education level: Not on file  Occupational History   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  Tobacco Use   Smoking status: Never  Smokeless tobacco: Never  Vaping Use   Vaping Use: Never used  Substance and Sexual Activity   Alcohol use: No   Drug use: No   Sexual activity: Yes    Partners: Male    Birth control/protection: Surgical    Comment: Hysterectomy  Other Topics Concern   Not on file  Social History Narrative   Not on file   Social Determinants of Health   Financial Resource Strain: Not on file  Food Insecurity: Not on file  Transportation Needs: Not on file  Physical Activity: Not on file  Stress: Not on file  Social Connections: Not on file  Intimate Partner Violence: Not on file      Review of Systems  Constitutional:  Negative for activity change, appetite change, chills, fatigue, fever and unexpected weight  change.  HENT: Negative.  Negative for congestion, ear pain, rhinorrhea, sore throat and trouble swallowing.   Eyes: Negative.   Respiratory: Negative.  Negative for cough, chest tightness, shortness of breath and wheezing.   Cardiovascular: Negative.  Negative for chest pain and palpitations.  Gastrointestinal: Negative.  Negative for abdominal pain, blood in stool, constipation, diarrhea, nausea and vomiting.  Endocrine: Negative.   Genitourinary: Negative.  Negative for difficulty urinating, dysuria, frequency, hematuria and urgency.  Musculoskeletal:  Positive for arthralgias (knees and neck). Negative for back pain, joint swelling, myalgias and neck pain.  Skin: Negative.  Negative for rash and wound.  Allergic/Immunologic: Negative.  Negative for immunocompromised state.  Neurological: Negative.  Negative for dizziness, seizures, numbness and headaches.  Hematological: Negative.   Psychiatric/Behavioral: Negative.  Negative for behavioral problems, self-injury and suicidal ideas. The patient is not nervous/anxious.    Vital Signs: BP 110/80   Pulse 71   Temp 98 F (36.7 C)   Resp 16   Ht '5\' 1"'$  (1.549 m)   Wt 184 lb (83.5 kg)   SpO2 99%   BMI 34.77 kg/m    Physical Exam Constitutional:      General: She is not in acute distress.    Appearance: She is well-developed. She is not diaphoretic.  HENT:     Head: Normocephalic and atraumatic.     Right Ear: External ear normal.     Left Ear: External ear normal.     Nose: Nose normal.     Mouth/Throat:     Pharynx: No oropharyngeal exudate.  Eyes:     General: No scleral icterus.       Right eye: No discharge.        Left eye: No discharge.     Conjunctiva/sclera: Conjunctivae normal.     Pupils: Pupils are equal, round, and reactive to light.  Neck:     Thyroid: No thyromegaly.     Vascular: No JVD.     Trachea: No tracheal deviation.  Cardiovascular:     Rate and Rhythm: Normal rate and regular rhythm.     Heart  sounds: Normal heart sounds. No murmur heard.   No friction rub. No gallop.  Pulmonary:     Effort: Pulmonary effort is normal. No respiratory distress.     Breath sounds: Normal breath sounds. No stridor. No wheezing or rales.  Chest:     Chest wall: No tenderness.  Abdominal:     General: Bowel sounds are normal. There is no distension.     Palpations: Abdomen is soft. There is no mass.     Tenderness: There is no abdominal tenderness. There is no guarding or rebound.  Musculoskeletal:        General: No tenderness or deformity. Normal range of motion.     Cervical back: Normal range of motion and neck supple.  Lymphadenopathy:     Cervical: No cervical adenopathy.  Skin:    General: Skin is warm and dry.     Coloration: Skin is not pale.     Findings: No erythema or rash.  Neurological:     Mental Status: She is alert.     Cranial Nerves: No cranial nerve deficit.     Motor: No abnormal muscle tone.     Coordination: Coordination normal.     Deep Tendon Reflexes: Reflexes are normal and symmetric.  Psychiatric:        Behavior: Behavior normal.        Thought Content: Thought content normal.        Judgment: Judgment normal.       Assessment/Plan: 1. Encounter for general adult medical examination with abnormal findings Age-appropriate preventive screenings and vaccinations discussed, annual physical exam completed. Routine labs for health maintenance ordered . PHM updated.   2. Vitamin D deficiency Repeat vitamin D level - Vitamin D (25 hydroxy)  3. Mixed hyperlipidemia Repeat lipid panel - Lipid Profile  4. Body mass index (BMI) of 34.0 to 34.9 in adult She is receiving assistance through her insurance via a life coach to help with diet and lifestyle modifications. She has lost 7 lbs since April 2022. Obesity Counseling: Risk Assessment: An assessment of behavioral risk factors was made today and includes lack of exercise sedentary lifestyle, lack of portion  control and poor dietary habits.  Risk Modification Advice: She was counseled on portion control guidelines. Working with life coach on diet modifications. She knows she must decrease high carb and high sugar foods. She needs to increase lean protein as well. The detrimental long term effects of obesity on her health and ongoing poor compliance was also discussed with the patient.   5. Dysuria Routine urinalysis done - UA/M w/rflx Culture, Routine - Microscopic Examination - Urine Culture, Reflex     General Counseling: Jamarria verbalizes understanding of the findings of todays visit and agrees with plan of treatment. I have discussed any further diagnostic evaluation that may be needed or ordered today. We also reviewed her medications today. she has been encouraged to call the office with any questions or concerns that should arise related to todays visit.    Orders Placed This Encounter  Procedures   Microscopic Examination   Urine Culture, Reflex   Lipid Profile   Vitamin D (25 hydroxy)   UA/M w/rflx Culture, Routine     No orders of the defined types were placed in this encounter.   Return in about 1 year (around 06/10/2022) for CPE, [place] PCP. Will call results of lipid panel and vitamin D level once resulted.    Total time spent:30 Minutes Time spent includes review of chart, medications, test results, and follow up plan with the patient.   [place] Controlled Substance Database was reviewed by me.  This patient was seen by Jonetta Osgood, FNP-C in collaboration with Dr. Clayborn Bigness as a part of collaborative care agreement.  Sunjai Levandoski R. Valetta Fuller, MSN, FNP-C Internal medicine

## 2021-06-17 LAB — UA/M W/RFLX CULTURE, ROUTINE
Bilirubin, UA: NEGATIVE
Glucose, UA: NEGATIVE
Ketones, UA: NEGATIVE
Leukocytes,UA: NEGATIVE
Nitrite, UA: NEGATIVE
Protein,UA: NEGATIVE
RBC, UA: NEGATIVE
Specific Gravity, UA: 1.026 (ref 1.005–1.030)
Urobilinogen, Ur: 0.2 mg/dL (ref 0.2–1.0)
pH, UA: 5 (ref 5.0–7.5)

## 2021-06-17 LAB — MICROSCOPIC EXAMINATION
Casts: NONE SEEN /lpf
RBC, Urine: NONE SEEN /hpf (ref 0–2)

## 2021-06-17 LAB — URINE CULTURE, REFLEX

## 2021-07-08 ENCOUNTER — Encounter: Payer: Self-pay | Admitting: Nurse Practitioner

## 2022-03-29 ENCOUNTER — Other Ambulatory Visit: Payer: Self-pay | Admitting: Internal Medicine

## 2022-03-29 ENCOUNTER — Other Ambulatory Visit: Payer: Self-pay | Admitting: Nurse Practitioner

## 2022-03-29 DIAGNOSIS — Z1231 Encounter for screening mammogram for malignant neoplasm of breast: Secondary | ICD-10-CM

## 2022-04-26 ENCOUNTER — Ambulatory Visit
Admission: RE | Admit: 2022-04-26 | Discharge: 2022-04-26 | Disposition: A | Discharge status: Home / Self Care | Payer: Managed Care, Other (non HMO) | Source: Ambulatory Visit | Attending: Nurse Practitioner | Admitting: Nurse Practitioner

## 2022-04-26 DIAGNOSIS — Z1231 Encounter for screening mammogram for malignant neoplasm of breast: Secondary | ICD-10-CM | POA: Diagnosis not present

## 2022-05-26 ENCOUNTER — Encounter: Payer: Managed Care, Other (non HMO) | Admitting: Nurse Practitioner

## 2022-06-07 ENCOUNTER — Telehealth: Payer: Self-pay | Admitting: Nurse Practitioner

## 2022-06-07 NOTE — Telephone Encounter (Signed)
Mailbox full, sent mychart message to confirm 06/14/22 appointment-Toni

## 2022-06-14 ENCOUNTER — Encounter: Payer: Managed Care, Other (non HMO) | Admitting: Nurse Practitioner

## 2023-04-25 ENCOUNTER — Ambulatory Visit (INDEPENDENT_AMBULATORY_CARE_PROVIDER_SITE_OTHER): Payer: Managed Care, Other (non HMO) | Admitting: Obstetrics and Gynecology

## 2023-04-25 ENCOUNTER — Encounter: Payer: Self-pay | Admitting: Obstetrics and Gynecology

## 2023-04-25 VITALS — BP 136/91 | HR 91 | Resp 16 | Ht 61.0 in | Wt 176.0 lb

## 2023-04-25 DIAGNOSIS — Z01419 Encounter for gynecological examination (general) (routine) without abnormal findings: Secondary | ICD-10-CM

## 2023-04-25 DIAGNOSIS — Z780 Asymptomatic menopausal state: Secondary | ICD-10-CM

## 2023-04-25 DIAGNOSIS — Z1231 Encounter for screening mammogram for malignant neoplasm of breast: Secondary | ICD-10-CM

## 2023-04-25 DIAGNOSIS — Z800 Family history of malignant neoplasm of digestive organs: Secondary | ICD-10-CM

## 2023-04-25 DIAGNOSIS — Z90711 Acquired absence of uterus with remaining cervical stump: Secondary | ICD-10-CM

## 2023-04-25 NOTE — Progress Notes (Addendum)
ANNUAL PREVENTATIVE CARE GYNECOLOGY  ENCOUNTER NOTE  Subjective:       Jennifer White is a 52 y.o. [phone] female here for a routine annual gynecologic exam. She was last seen by Dr. Adelene Idler in 2022. The patient is sexually active. The patient is not taking hormone replacement therapy. Patient denies post-menopausal vaginal bleeding. The patient wears seatbelts: yes. The patient participates in regular exercise: yes. Has the patient ever been transfused or tattooed?: no. The patient reports that there is not domestic violence in her life.  Current complaints: 1.  None.  Does desire that her mom was recently diagnosed with colon cancer.  However does note that they think it was due to medications (infusions) that she was undergoing as this is a known side effect.  Wonders how this may affect her screening.   Gynecologic History No LMP recorded. Patient has had a hysterectomy. Contraception: status post hysterectomy Last Pap: 03/25/2021. Results were: normal Last mammogram: 04/26/2022. Results were: normal Last Colonoscopy: performed in 2022.  Benign polyps noted. Due for repeat after 5 years.  Last Dexa Scan: Never had one   Obstetric History OB History  Gravida Para Term Preterm AB Living  4 2 2   2 2   SAB IAB Ectopic Multiple Live Births  2       2    # Outcome Date GA Lbr Len/2nd Weight Sex Type Anes PTL Lv  4 Term 01/24/01 [redacted]w[redacted]d  6 lb 12 oz (3.062 kg) F CS-LTranv   LIV  3 Term 10/20/91 [redacted]w[redacted]d  7 lb 12 oz (3.515 kg) M CS-LTranv   LIV     Complications: Fetal Intolerance  2 SAB           1 SAB             Past Medical History:  Diagnosis Date   Anemia    Asthma    Endometriosis    Fibroid    Lactose intolerance    Vitamin D deficiency     Family History  Problem Relation Age of Onset   Hypertension Father    Lung cancer Father 69   Atrial fibrillation Father    Colon cancer Paternal Grandmother 79       had a blockage but unsure if colon cancer or  stomach cancer   Diabetes Paternal Grandmother        amputee   Hypertension Paternal Grandmother    Prostate cancer Paternal Grandfather    Arthritis Mother    Breast cancer Neg Hx     Past Surgical History:  Procedure Laterality Date   CESAREAN SECTION     1993/2002   CHOLECYSTECTOMY, LAPAROSCOPIC  2004   COLONOSCOPY  2011   normal   COLONOSCOPY  03/20/2019   DILATION AND CURETTAGE OF UTERUS  1988   septic AB/PID/+GC   LAPAROSCOPIC SUPRACERVICAL HYSTERECTOMY  09/24/2013   and bilateral salpingectomy, minilaparotomy and cystoscopy   OOPHORECTOMY  10/28/2013   left for ovarian torsion    Social History   Socioeconomic History   Marital status: Married    Spouse name: Not on file   Number of children: 2   Years of education: Not on file   Highest education level: Not on file  Occupational History   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  Tobacco Use   Smoking status: Never   Smokeless tobacco: Never  Vaping Use   Vaping status: Never Used  Substance and Sexual Activity   Alcohol use: No  Drug use: No   Sexual activity: Yes    Partners: Male    Birth control/protection: Surgical    Comment: Hysterectomy  Other Topics Concern   Not on file  Social History Narrative   Not on file   Social Determinants of Health   Financial Resource Strain: Not on file  Food Insecurity: Not on file  Transportation Needs: Not on file  Physical Activity: Sufficiently Active (02/04/2018)   Exercise Vital Sign    Days of Exercise per Week: 5 days    Minutes of Exercise per Session: 30 min  Stress: No Stress Concern Present (02/04/2018)   [employer] of Occupational Health - Occupational Stress Questionnaire    Feeling of Stress : Not at all  Social Connections: Not on file  Intimate Partner Violence: Not on file    Current Outpatient Medications on File Prior to Visit  Medication Sig Dispense Refill   albuterol (VENTOLIN HFA) 108 (90 Base) MCG/ACT inhaler Inhale 1 puff  into the lungs every 6 (six) hours as needed for wheezing or shortness of breath. 1 Inhaler 5   cholecalciferol (VITAMIN D) 1000 units tablet Take 1,000 Units by mouth daily.     vitamin C (ASCORBIC ACID) 500 MG tablet Take 500 mg by mouth daily.     vitamin E 180 MG (400 UNITS) capsule Take 400 Units by mouth daily.     No current facility-administered medications on file prior to visit.    No Known Allergies    Review of Systems ROS Review of Systems - General ROS: negative for - chills, fatigue, fever, hot flashes, night sweats, weight gain or weight loss Psychological ROS: negative for - anxiety, decreased libido, depression, mood swings, physical abuse or sexual abuse Ophthalmic ROS: negative for - blurry vision, eye pain or loss of vision ENT ROS: negative for - headaches, hearing change, visual changes or vocal changes Allergy and Immunology ROS: negative for - hives, itchy/watery eyes or seasonal allergies Hematological and Lymphatic ROS: negative for - bleeding problems, bruising, swollen lymph nodes or weight loss Endocrine ROS: negative for - galactorrhea, hair pattern changes, hot flashes, malaise/lethargy, mood swings, palpitations, polydipsia/polyuria, skin changes, temperature intolerance or unexpected weight changes Breast ROS: negative for - new or changing breast lumps or nipple discharge Respiratory ROS: negative for - cough or shortness of breath Cardiovascular ROS: negative for - chest pain, irregular heartbeat, palpitations or shortness of breath Gastrointestinal ROS: no abdominal pain, change in bowel habits, or black or bloody stools Genito-Urinary ROS: no dysuria, trouble voiding, or hematuria Musculoskeletal ROS: negative for - joint pain or joint stiffness Neurological ROS: negative for - bowel and bladder control changes Dermatological ROS: negative for rash and skin lesion changes   Objective:   BP (!) 136/91   Pulse 91   Resp 16   Ht 5\' 1"  (1.549 m)    Wt 176 lb (79.8 kg)   BMI 33.25 kg/m  CONSTITUTIONAL: Well-developed, well-nourished female in no acute distress.  PSYCHIATRIC: Normal mood and affect. Normal behavior. Normal judgment and thought content. NEUROLGIC: Alert and oriented to person, place, and time. Normal muscle tone coordination. No cranial nerve deficit noted. HENT:  Normocephalic, atraumatic, External right and left ear normal. Oropharynx is clear and moist EYES: Conjunctivae and EOM are normal. Pupils are equal, round, and reactive to light. No scleral icterus.  NECK: Normal range of motion, supple, no masses.  Normal thyroid.  SKIN: Skin is warm and dry. No rash noted. Not diaphoretic. No erythema.  No pallor. CARDIOVASCULAR: Normal heart rate noted, regular rhythm, no murmur. RESPIRATORY: Clear to auscultation bilaterally. Effort and breath sounds normal, no problems with respiration noted. BREASTS: Symmetric in size. No masses, skin changes, nipple drainage, or lymphadenopathy. ABDOMEN: Soft, normal bowel sounds, no distention noted.  No tenderness, rebound or guarding.  BLADDER: Normal PELVIC:  Bladder no bladder distension noted  Urethra: normal appearing urethra with no masses, tenderness or lesions  Vulva: normal appearing vulva with no masses, tenderness or lesions  Vagina: normal appearing vagina with normal color and discharge, no lesions  Cervix: normal appearing cervix without discharge or lesions  Uterus: surgically absent.  Cervix remains.   Adnexa: normal adnexa on right, non-tender, no masses. Left adxexa surgically absent.   RV: External Exam NormaI, No Rectal Masses, and Normal Sphincter tone  MUSCULOSKELETAL: Normal range of motion. No tenderness.  No cyanosis, clubbing, or edema.  2+ distal pulses. LYMPHATIC: No Axillary, Supraclavicular, or Inguinal Adenopathy.   Labs: Lab Results  Component Value Date   WBC 5.8 01/12/2021   HGB 14.2 01/12/2021   HCT 42.5 01/12/2021   MCV 87 01/12/2021   PLT  199 01/12/2021    Lab Results  Component Value Date   CREATININE 0.98 01/12/2021   BUN 22 01/12/2021   NA 141 01/12/2021   K 4.3 01/12/2021   CL 104 01/12/2021   CO2 23 01/12/2021    Lab Results  Component Value Date   ALT 32 01/12/2021   AST 20 01/12/2021   ALKPHOS 103 01/12/2021   BILITOT 0.6 01/12/2021    Lab Results  Component Value Date   CHOL 200 (H) 01/12/2021   HDL 57 01/12/2021   LDLCALC 128 (H) 01/12/2021   TRIG 84 01/12/2021    Lab Results  Component Value Date   TSH 3.830 01/12/2021    No results found for: "HGBA1C"   Assessment:   1. Encounter for well woman exam with routine gynecological exam   2. Encounter for screening mammogram for malignant neoplasm of breast   3. History of hysterectomy, supracervical   4. Menopause   5. Family history of colon cancer      Plan:  - Pap:  Up to date. Due in 1 year.  - Mammogram: Ordered - Colon Screening:   Up to date.  For repeat in 5 years.  I discussed that earlier screening may be warranted now that she has 2 family members with colon cancer (her mother, and paternal grandmother).  May also need to consider hereditary genetic screening. - Labs:  None ordered. 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  - Routine preventative health maintenance measures emphasized: Exercise/Diet/Weight control, Tobacco Warnings, Alcohol/Substance use risks, and Stress Management.  - Encourage use of Vitamin D/Calcium supplements for prevention of osteoporosis. Patient also mentions starting a new OTC supplement (J & Pasty Arch) for menopausal supplementation.   Return to Clinic - 1 Year   Hildred Laser, MD [facility]

## 2023-04-27 ENCOUNTER — Telehealth: Payer: Self-pay | Admitting: Internal Medicine

## 2023-04-27 NOTE — Telephone Encounter (Signed)
Pt is no longer our pt, pt has another PCP-nm

## 2023-05-07 ENCOUNTER — Telehealth: Payer: Self-pay | Admitting: Internal Medicine

## 2023-05-07 NOTE — Telephone Encounter (Signed)
Patient called regarding MR that are supposed to be sent to [facility]. She stated she came into office and completed release form.  No documentation either in chart or log book. Told patient I will check with Nubi when she returns to office. I also explained to her that [facility] has access to her chart since they use Epic. She still wants MR sent to them-Toni

## 2023-05-08 ENCOUNTER — Telehealth: Payer: Self-pay | Admitting: Internal Medicine

## 2023-05-08 ENCOUNTER — Encounter: Payer: Self-pay | Admitting: Family Medicine

## 2023-05-08 ENCOUNTER — Ambulatory Visit: Payer: Managed Care, Other (non HMO) | Admitting: Family Medicine

## 2023-05-08 VITALS — BP 110/74 | HR 83 | Ht 61.0 in | Wt 180.0 lb

## 2023-05-08 DIAGNOSIS — E559 Vitamin D deficiency, unspecified: Secondary | ICD-10-CM

## 2023-05-08 DIAGNOSIS — Z23 Encounter for immunization: Secondary | ICD-10-CM

## 2023-05-08 DIAGNOSIS — E785 Hyperlipidemia, unspecified: Secondary | ICD-10-CM | POA: Diagnosis not present

## 2023-05-08 DIAGNOSIS — N951 Menopausal and female climacteric states: Secondary | ICD-10-CM

## 2023-05-08 DIAGNOSIS — Z7689 Persons encountering health services in other specified circumstances: Secondary | ICD-10-CM | POA: Diagnosis not present

## 2023-05-08 DIAGNOSIS — M8580 Other specified disorders of bone density and structure, unspecified site: Secondary | ICD-10-CM | POA: Insufficient documentation

## 2023-05-08 DIAGNOSIS — Z6832 Body mass index (BMI) 32.0-32.9, adult: Secondary | ICD-10-CM | POA: Diagnosis not present

## 2023-05-08 DIAGNOSIS — S0340XD Sprain of jaw, unspecified side, subsequent encounter: Secondary | ICD-10-CM

## 2023-05-08 DIAGNOSIS — S0340XA Sprain of jaw, unspecified side, initial encounter: Secondary | ICD-10-CM | POA: Insufficient documentation

## 2023-05-08 DIAGNOSIS — R140 Abdominal distension (gaseous): Secondary | ICD-10-CM | POA: Insufficient documentation

## 2023-05-08 DIAGNOSIS — Z0000 Encounter for general adult medical examination without abnormal findings: Secondary | ICD-10-CM

## 2023-05-08 NOTE — Assessment & Plan Note (Signed)
Chronic  No current pain medications for this problem

## 2023-05-08 NOTE — Assessment & Plan Note (Signed)
Patient reports joint pain and has history of osteopenia with T score -1.4 from 2016 bone density scan. -Order repeat bone density scan. -Encourage weight-bearing exercises and adequate calcium intake and vitamin D  Patient reports she is unable to tolerate calcium supplementation due to GI effects

## 2023-05-08 NOTE — Assessment & Plan Note (Signed)
Patient taking 5000 units of Vitamin D3 with K2 daily. Chronic,stable on regimen  -Order labs to check Vitamin D levels. Continue current supplements

## 2023-05-08 NOTE — Assessment & Plan Note (Signed)
Chronic  Patient prefers to avoid hormonal/medications at this time

## 2023-05-08 NOTE — Telephone Encounter (Signed)
Faxed; [phone] all MR to [facility]

## 2023-05-08 NOTE — Assessment & Plan Note (Addendum)
DEXA scan ordered  Shingrix vaccine administered today, patient tolerated well

## 2023-05-08 NOTE — Assessment & Plan Note (Signed)
Patient reports current weight at 175-176 lbs, has joined [employer] and is engaging in physical activity. Chronic  BMI 34.01 -Encourage continued lifestyle modifications for weight loss.

## 2023-05-08 NOTE — Assessment & Plan Note (Signed)
Welcomed patient to [facility]  Reviewed patient's medical history, medications, surgical and social history Discussed roles and expectations for primary care physician-patient relationship Recommended patient schedule annual preventative examinations   

## 2023-05-08 NOTE — Progress Notes (Signed)
New patient visit   Patient: Jennifer White   DOB: Feb 07, 1971   52 y.o. Female  MRN: [id number] Visit Date: 05/08/2023  Today's healthcare provider: Ronnald Ramp, MD   Chief Complaint  Patient presents with   New Patient (Initial Visit)    Would like to check gut health, and make sure she is absorbing vitamins that she is taking otc. Advised prediabetic, and would like to receive shingles and pneumonia vaccine. Patient would also like to update her bone density and colonoscopy as mother was recently diagnosed.    Subjective    Jennifer White is a 52 y.o. female who presents today as a new patient to establish care.   HPI     New Patient (Initial Visit)    Additional comments: Would like to check gut health, and make sure she is absorbing vitamins that she is taking otc. Advised prediabetic, and would like to receive shingles and pneumonia vaccine. Patient would also like to update her bone density and colonoscopy as mother was recently diagnosed.       Last edited by Acey Lav, CMA on 05/08/2023  9:29 AM.       Discussed the use of AI scribe software for clinical note transcription with the patient, who gave verbal consent to proceed.  History of Present Illness   The patient is a 52 year old individual who presents to establish care. She reports a history of prediabetes, with a recent hemoglobin A1c of 5.8, and elevated LDL cholesterol at 120. She also has a history of benign polyps found on colonoscopy in 2022, with a recommended repeat in 2027.  The patient is currently taking daily Vitamin D and K supplements, but expresses concern about potential liver damage due to the supplements. She reports a persistent, low-grade pain on her right side, which she has previously attributed to constipation or menopause. She expresses a desire to ensure that her symptoms are not being dismissed as menopausal when they could be indicative of other health  issues.  The patient also reports feeling generally unwell, with no specific symptoms. She mentions struggling with weight control, despite efforts to increase physical activity and improve diet. She expresses frustration with a lack of sustainable results from her efforts. 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  The patient has a history of asthma, but reports no recent issues. She also reports a history of a heart murmur, but this diagnosis seems to be in question. She has a history of osteopenia, diagnosed in 2016, and expresses concern about bone health. She reports joint pain, particularly in the knees and lower back, and mentions hearing a noise in her bones when walking.  The patient has had a partial hysterectomy and retains one ovary. She reports symptoms consistent with menopause, including night sweats and body aches. She expresses a desire to manage these symptoms naturally, without hormone replacement therapy.  The patient works long hours in a sedentary job and is about to start a [employer], which will also involve long hours of sitting. She expresses concern about the impact of this lifestyle on her health and is seeking advice on how to optimize her health in preparation for this new challenge.          Past Medical History:  Diagnosis Date   Anemia    Asthma    Endometriosis    Fibroid    Lactose intolerance  Vitamin D deficiency    Past Surgical History:  Procedure Laterality Date   CESAREAN SECTION     1993/2002   CHOLECYSTECTOMY, LAPAROSCOPIC  2004   COLONOSCOPY  2011   normal   COLONOSCOPY  03/20/2019   DILATION AND CURETTAGE OF UTERUS  1988   septic AB/PID/+GC   LAPAROSCOPIC SUPRACERVICAL HYSTERECTOMY  09/24/2013   and bilateral salpingectomy, minilaparotomy and cystoscopy   OOPHORECTOMY  10/28/2013   left for ovarian torsion   Family Status  Relation  Name Status   Mother  Alive   Father  Alive   MGM  Deceased   MGF  Deceased   PGM  Deceased   PGF  Deceased   Neg Hx  (Not Specified)  No partnership data on file   Family History  Problem Relation Age of Onset   Colon cancer Mother 6   Arthritis Mother    Hypertension Father    Lung cancer Father 79   Atrial fibrillation Father    Colon cancer Paternal Grandmother 68       had a blockage but unsure if colon cancer or stomach cancer   Diabetes Paternal Grandmother        amputee   Hypertension Paternal Grandmother    Prostate cancer Paternal Grandfather    Breast cancer Neg Hx    Social History   Socioeconomic History   Marital status: Married    Spouse name: Not on file   Number of children: 2   Years of education: Not on file   Highest education level: Not on file  Occupational History   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  Tobacco Use   Smoking status: Never   Smokeless tobacco: Never  Vaping Use   Vaping status: Never Used  Substance and Sexual Activity   Alcohol use: No   Drug use: No   Sexual activity: Yes    Partners: Male    Birth control/protection: Surgical    Comment: Hysterectomy  Other Topics Concern   Not on file  Social History Narrative   Not on file   Social Determinants of Health   Financial Resource Strain: Not on file  Food Insecurity: Not on file  Transportation Needs: Not on file  Physical Activity: Sufficiently Active (02/04/2018)   Exercise Vital Sign    Days of Exercise per Week: 5 days    Minutes of Exercise per Session: 30 min  Stress: No Stress Concern Present (02/04/2018)   [employer] of Occupational Health - Occupational Stress Questionnaire    Feeling of Stress : Not at all  Social Connections: Not on file    Outpatient Medications Prior to Visit  Medication Sig   cholecalciferol (VITAMIN D) 1000 units tablet Take 1,000 Units by mouth daily. Take 5000 units daily   vitamin C (ASCORBIC ACID) 500 MG tablet Take  500 mg by mouth daily.   vitamin E 180 MG (400 UNITS) capsule Take 400 Units by mouth daily.   No facility-administered medications prior to visit.   Allergies  Allergen Reactions   Ciprofloxacin     Leg cramping and mental status changes    Immunization History  Administered Date(s) Administered   MMR 07/01/2018   PFIZER(Purple Top)SARS-COV-2 Vaccination 01/12/2020, 02/04/2020   Zoster Recombinant(Shingrix) 05/08/2023    Health Maintenance  Topic Date Due   HIV Screening  Never done   DTaP/Tdap/Td (1 - Tdap) Never done   COVID-19 Vaccine (3 - 2023-24 season) 05/26/2022   INFLUENZA VACCINE  04/26/2023   Zoster  Vaccines- Shingrix (2 of 2) 07/03/2023   PAP SMEAR-Modifier  03/25/2024   MAMMOGRAM  04/26/2024   Colonoscopy  01/23/2029   HPV VACCINES  Aged Out   Hepatitis C Screening  Discontinued    Patient Care Team: Ronnald Ramp, MD as PCP - General (Family Medicine)  Review of Systems      Objective    BP 110/74 (BP Location: Right Arm, Patient Position: Sitting, Cuff Size: Normal)   Pulse 83   Ht 5\' 1"  (1.549 m)   Wt 180 lb (81.6 kg)   SpO2 100%   BMI 34.01 kg/m      Depression Screen    05/08/2023    9:26 AM 04/25/2023    9:27 AM 06/10/2021   10:29 AM 07/14/2020    3:55 PM  PHQ 2/9 Scores  PHQ - 2 Score 0 0 0 0   No results found for any visits on 05/08/23.   Physical Exam Vitals reviewed.  Constitutional:      General: She is not in acute distress.    Appearance: Normal appearance. She is not ill-appearing, toxic-appearing or diaphoretic.  Eyes:     Conjunctiva/sclera: Conjunctivae normal.  Cardiovascular:     Rate and Rhythm: Normal rate and regular rhythm.     Pulses: Normal pulses.     Heart sounds: Normal heart sounds. No murmur heard.    No friction rub. No gallop.  Pulmonary:     Effort: Pulmonary effort is normal. No respiratory distress.     Breath sounds: Normal breath sounds. No stridor. No wheezing, rhonchi or  rales.  Abdominal:     General: Bowel sounds are normal. There is no distension.     Palpations: Abdomen is soft.     Tenderness: There is no abdominal tenderness.  Musculoskeletal:     Right lower leg: No edema.     Left lower leg: No edema.  Skin:    Findings: No erythema or rash.  Neurological:     Mental Status: She is alert and oriented to person, place, and time.       Assessment & Plan      Problem List Items Addressed This Visit     BMI 32.0-32.9,adult    Patient reports current weight at 175-176 lbs, has joined [employer] and is engaging in physical activity. Chronic  BMI 34.01 -Encourage continued lifestyle modifications for weight loss.      Relevant Orders   TSH   Comprehensive metabolic panel   CBC   Establishing care with new doctor, encounter for    Welcomed patient to [facility]  Reviewed patient's medical history, medications, surgical and social history Discussed roles and expectations for primary care physician-patient relationship Recommended patient schedule annual preventative examinations        Flatulence/gas pain/belching    Patient reports intermittent, mild, left-sided abdominal pain. -chronic, intermittent left sided pains that are short lived  -recommended applying heat therapy to help with pain  -considered gas as possible cause of pain along with constipation  -patient prefers natural therapies for now  -Order complete metabolic panel  Patient reports frequent belching and potential constipation. -Order celiac panel, B12, folate, and CBC to assess for potential absorption issues. -Recommend dietary modifications such as papaya and mint tea for gas relief.       Relevant Orders   Celiac Disease Panel   B12 and Folate Panel   CBC   Healthcare maintenance    DEXA scan ordered  Shingrix vaccine  administered today, patient tolerated well       Hyperlipidemia LDL goal <100 - Primary    LDL elevated at 120,  HDL within goal range at 66. Chronic  No current statin therapy  -Continue lifestyle modifications including diet and exercise.      Osteopenia after menopause    Patient reports joint pain and has history of osteopenia with T score -1.4 from 2016 bone density scan. -Order repeat bone density scan. -Encourage weight-bearing exercises and adequate calcium intake and vitamin D  Patient reports she is unable to tolerate calcium supplementation due to GI effects       Relevant Orders   DG Bone Density   Perimenopausal vasomotor symptoms    Chronic  Patient prefers to avoid hormonal/medications at this time        Sprain of temporomandibular joint or ligament    Chronic  No current pain medications for this problem       Vitamin D deficiency    Patient taking 5000 units of Vitamin D3 with K2 daily. Chronic,stable on regimen  -Order labs to check Vitamin D levels. Continue current supplements       Relevant Orders   Vitamin D (25 hydroxy)   CBC   Other Visit Diagnoses     Immunization due       Relevant Orders   Zoster Recombinant (Shingrix ) (Completed)           Prediabetes Hemoglobin A1c at 5.8, indicating prediabetes. -Continue lifestyle modifications including diet and exercise.   Thyroid function Patient has concerns about potential thyroid issues. -Order thyroid function tests.         Return in about 6 weeks (around 06/19/2023) for gut health .      Ronnald Ramp, MD  [facility] [phone] (phone) [phone] (fax)  [facility]

## 2023-05-08 NOTE — Assessment & Plan Note (Signed)
LDL elevated at 120, HDL within goal range at 66. Chronic  No current statin therapy  -Continue lifestyle modifications including diet and exercise.

## 2023-05-08 NOTE — Patient Instructions (Addendum)
It was a pleasure meeting you today!  Welcome to [facility].  I look forward to taking part in your care as your new primary care physician.    Summary of our discussion today:   We will follow up with results of labs once they are available.    Please remember to schedule your annual physical one year from your last physical.   You should return to our clinic in 6 weeks for follow up for labs.   Best Wishes,   Dr. Allyson Sabal [facility] at [facility] [address] [place],  [place]  [address] Get Driving Directions Main: [phone]

## 2023-05-08 NOTE — Assessment & Plan Note (Addendum)
Patient reports intermittent, mild, left-sided abdominal pain. -chronic, intermittent left sided pains that are short lived  -recommended applying heat therapy to help with pain  -considered gas as possible cause of pain along with constipation  -patient prefers natural therapies for now  -Order complete metabolic panel  Patient reports frequent belching and potential constipation. -Order celiac panel, B12, folate, and CBC to assess for potential absorption issues. -Recommend dietary modifications such as papaya and mint tea for gas relief.

## 2023-05-11 ENCOUNTER — Ambulatory Visit
Admission: RE | Admit: 2023-05-11 | Discharge: 2023-05-11 | Disposition: A | Discharge status: Home / Self Care | Payer: Managed Care, Other (non HMO) | Source: Ambulatory Visit | Attending: Obstetrics and Gynecology | Admitting: Obstetrics and Gynecology

## 2023-05-11 DIAGNOSIS — Z1231 Encounter for screening mammogram for malignant neoplasm of breast: Secondary | ICD-10-CM | POA: Insufficient documentation

## 2023-05-11 DIAGNOSIS — Z01419 Encounter for gynecological examination (general) (routine) without abnormal findings: Secondary | ICD-10-CM | POA: Insufficient documentation

## 2023-06-15 ENCOUNTER — Ambulatory Visit: Payer: Managed Care, Other (non HMO) | Admitting: Family Medicine

## 2023-06-15 ENCOUNTER — Encounter: Payer: Self-pay | Admitting: Family Medicine

## 2023-06-15 VITALS — BP 105/83 | HR 94 | Ht 61.0 in | Wt 175.0 lb

## 2023-06-15 DIAGNOSIS — R109 Unspecified abdominal pain: Secondary | ICD-10-CM | POA: Diagnosis not present

## 2023-06-15 DIAGNOSIS — R748 Abnormal levels of other serum enzymes: Secondary | ICD-10-CM | POA: Diagnosis not present

## 2023-06-15 NOTE — Patient Instructions (Addendum)
[phone]   Imaging [facility] for your bone density scan that has been ordered    Low-FOD MAP Eating Plan  FODMAP stands for fermentable oligosaccharides, disaccharides, monosaccharides, and polyols. These are sugars that are hard for some people to digest. A low-FODMAP eating plan may help some people who have irritable bowel syndrome (IBS) and certain other bowel (intestinal) diseases to manage their symptoms. This meal plan can be complicated to follow. Work with a diet and nutrition specialist (dietitian) to make a low-FODMAP eating plan that is right for you. A dietitian can help make sure that you get enough nutrition from this diet. What are tips for following this plan? Reading food labels Check labels for hidden FODMAPs such as: High-fructose syrup. Honey. Agave. Natural fruit flavors. Onion or garlic powder. Choose low-FODMAP foods that contain 3-4 grams of fiber per serving. Check food labels for serving sizes. Eat only one serving at a time to make sure FODMAP levels stay low. Sho[employer] Shop with a list of foods that are recommended on this diet and make a meal plan. Meal planning Follow a low-FODMAP eating plan for up to 6 weeks, or as told by your health care provider or dietitian. To follow the eating plan: Eliminate high-FODMAP foods from your diet completely. Choose only low-FODMAP foods to eat. You will do this for 2-6 weeks. Gradually reintroduce high-FODMAP foods into your diet one at a time. Most people should wait a few days before introducing the next new high-FODMAP food into their meal plan. Your dietitian can recommend how quickly you may reintroduce foods. Keep a daily record of what and how much you eat and drink. Make note of any symptoms that you have after eating. Review your daily record with a dietitian regularly to identify which foods you can eat and which foods you should avoid. General tips Drink enough fluid each day to keep your urine pale  yellow. Avoid processed foods. These often have added sugar and may be high in FODMAPs. Avoid most dairy products, whole grains, and sweeteners. Work with a dietitian to make sure you get enough fiber in your diet. Avoid high FODMAP foods at meals to manage symptoms. Recommended foods Fruits Bananas, oranges, tangerines, lemons, limes, blueberries, raspberries, strawberries, grapes, cantaloupe, honeydew melon, kiwi, papaya, passion fruit, and pineapple. Limited amounts of dried cranberries, banana chips, and shredded coconut. Vegetables Eggplant, zucchini, cucumber, peppers, green beans, bean sprouts, lettuce, arugula, kale, Swiss chard, spinach, collard greens, bok choy, summer squash, potato, and tomato. Limited amounts of corn, carrot, and sweet potato. Green parts of scallions. Grains Gluten-free grains, such as rice, oats, buckwheat, quinoa, corn, polenta, and millet. Gluten-free pasta, bread, or cereal. Rice noodles. Corn tortillas. Meats and other proteins Unseasoned beef, pork, poultry, or fish. Eggs. Jennifer White. Tofu (firm) and tempeh. Limited amounts of nuts and seeds, such as almonds, walnuts, [place] nuts, pecans, peanuts, nut butters, pumpkin seeds, chia seeds, and sunflower seeds. Dairy Lactose-free milk, yogurt, and kefir. Lactose-free cottage cheese and ice cream. Non-dairy milks, such as almond, coconut, hemp, and rice milk. Non-dairy yogurt. Limited amounts of goat cheese, brie, mozzarella, parmesan, swiss, and other hard cheeses. Fats and oils Butter-free spreads. Vegetable oils, such as olive, canola, and sunflower oil. Seasoning and other foods Artificial sweeteners with names that do not end in "ol," such as aspartame, saccharine, and stevia. Maple syrup, white table sugar, raw sugar, brown sugar, and molasses. Mayonnaise, soy sauce, and tamari. Fresh basil, coriander, parsley, rosemary, and thyme. Beverages Water and mineral  water. Sugar-sweetened soft drinks. Small amounts of  orange juice or cranberry juice. Black and green tea. Most dry wines. Coffee. The items listed above may not be a complete list of foods and beverages you can eat. Contact a dietitian for more information. Foods to avoid Fruits Fresh, dried, and juiced forms of apple, pear, watermelon, peach, plum, cherries, apricots, blackberries, boysenberries, figs, nectarines, and mango. Avocado. Vegetables Chicory root, artichoke, asparagus, cabbage, snow peas, Brussels sprouts, broccoli, sugar snap peas, mushrooms, celery, and cauliflower. Onions, garlic, leeks, and the white part of scallions. Grains Wheat, including kamut, durum, and semolina. Barley and bulgur. Couscous. Wheat-based cereals. Wheat noodles, bread, crackers, and pastries. Meats and other proteins Fried or fatty meat. Sausage. Cashews and pistachios. Soybeans, baked beans, black beans, chickpeas, kidney beans, fava beans, navy beans, lentils, black-eyed peas, and split peas. Dairy Milk, yogurt, ice cream, and soft cheese. Cream and sour cream. Milk-based sauces. Custard. Buttermilk. Soy milk. Seasoning and other foods Any sugar-free gum or candy. Foods that contain artificial sweeteners such as sorbitol, mannitol, isomalt, or xylitol. Foods that contain honey, high-fructose corn syrup, or agave. Bouillon, vegetable stock, beef stock, and chicken stock. Garlic and onion powder. Condiments made with onion, such as hummus, chutney, pickles, relish, salad dressing, and salsa. Tomato paste. Beverages Chicory-based drinks. Coffee substitutes. Chamomile tea. Fennel tea. Sweet or fortified wines such as port or sherry. Diet soft drinks made with isomalt, mannitol, maltitol, sorbitol, or xylitol. Apple, pear, and mango juice. Juices with high-fructose corn syrup. The items listed above may not be a complete list of foods and beverages you should avoid. Contact a dietitian for more information. Summary FODMAP stands for fermentable oligosaccharides,  disaccharides, monosaccharides, and polyols. These are sugars that are hard for some people to digest. A low-FODMAP eating plan is a short-term diet that helps to ease symptoms of certain bowel diseases. The eating plan usually lasts up to 6 weeks. After that, high-FODMAP foods are reintroduced gradually and one at a time. This can help you find out which foods may be causing symptoms. A low-FODMAP eating plan can be complicated. It is best to work with a dietitian who has experience with this type of plan. This information is not intended to replace advice given to you by your health care provider. Make sure you discuss any questions you have with your health care provider. Document Revised: 01/29/2020 Document Reviewed: 01/29/2020 Elsevier Patient Education  2024 [employer].

## 2023-06-15 NOTE — Progress Notes (Signed)
Established patient visit   Patient: Jennifer White   DOB: 1971-04-18   52 y.o. Female  MRN: [id number]  Visit Date: 06/15/2023  Today's healthcare provider: Ronnald Ramp, MD   Chief Complaint  Patient presents with   Medical Management of Chronic Issues    Follow up for gut health and possible shingles vaccine round 2, no new concerns today   Subjective     HPI     Medical Management of Chronic Issues    Additional comments: Follow up for gut health and possible shingles vaccine round 2, no new concerns today      Last edited by Rolly Salter, CMA on 06/15/2023  9:34 AM.       Discussed the use of AI scribe software for clinical note transcription with the patient, who gave verbal consent to proceed.  History of Present Illness   The patient, a 52 year old individual with a history of menopause and recent weight loss, presents with persistent gastrointestinal symptoms. She reports a sensation of gas and frequent belching, which has not improved despite dietary modifications and cessation of supplements. The patient also notes a vague, low-grade discomfort on the right side of the abdomen, which has slightly improved recently. She expresses concern about potential pancreatic issues, although the location and nature of the discomfort do not align with typical pancreatic pain.  The patient has been proactive in managing her health, focusing on a diet rich in vegetables and avoiding foods that are difficult to digest due to a previous gallbladder removal. Despite these efforts, she continues to experience gastrointestinal discomfort. She also reports lower back pain, which she attributes to a need for a new mattress.  The patient has a history of two C-sections and a hysterectomy, with noted scarring. She also expresses concern about potential liver issues, as a previous doctor made reference to this, and recent labs showed slightly elevated liver enzymes.  The patient has stopped all supplements to see if this might be contributing to the liver enzyme elevation.  The patient's mother has a history of rheumatoid arthritis and recent colon cancer, which the patient is aware could have genetic implications. She expresses a desire to be proactive in managing her health and preventing potential future issues. She is open to further testing and consultations to better understand and manage her symptoms.         Past Medical History:  Diagnosis Date   Anemia    Asthma    Endometriosis    Fibroid    Lactose intolerance    Vitamin D deficiency     Medications: Outpatient Medications Prior to Visit  Medication Sig   cholecalciferol (VITAMIN D) 1000 units tablet Take 1,000 Units by mouth daily. Take 5000 units daily (Patient not taking: Reported on 06/15/2023)   vitamin C (ASCORBIC ACID) 500 MG tablet Take 500 mg by mouth daily. (Patient not taking: Reported on 06/15/2023)   vitamin E 180 MG (400 UNITS) capsule Take 400 Units by mouth daily. (Patient not taking: Reported on 06/15/2023)   No facility-administered medications prior to visit.    Review of Systems  Last CBC Lab Results  Component Value Date   WBC 6.4 05/08/2023   HGB 13.4 05/08/2023   HCT 41.3 05/08/2023   MCV 89 05/08/2023   MCH 29.0 05/08/2023   RDW 13.0 05/08/2023   PLT 215 05/08/2023   Last metabolic panel Lab Results  Component Value Date   GLUCOSE 93 05/08/2023  NA 143 05/08/2023   K 3.8 05/08/2023   CL 105 05/08/2023   CO2 23 05/08/2023   BUN 17 05/08/2023   CREATININE 0.90 05/08/2023   EGFR 76 05/08/2023   CALCIUM 9.6 05/08/2023   PROT 7.1 05/08/2023   ALBUMIN 4.6 05/08/2023   LABGLOB 2.5 05/08/2023   AGRATIO 1.7 01/12/2021   BILITOT 0.5 05/08/2023   ALKPHOS 115 05/08/2023   AST 37 05/08/2023   ALT 51 (H) 05/08/2023   ANIONGAP 9 11/06/2013   Last thyroid functions Lab Results  Component Value Date   TSH 2.620 05/08/2023   T3TOTAL 99 03/30/2020         Objective    BP 105/83 (BP Location: Right Arm, Patient Position: Sitting, Cuff Size: Normal)   Pulse 94   Ht 5\' 1"  (1.549 m)   Wt 175 lb (79.4 kg)   SpO2 100%   BMI 33.07 kg/m   BP Readings from Last 3 Encounters:  06/15/23 105/83  05/08/23 110/74  04/25/23 (!) 136/91   Wt Readings from Last 3 Encounters:  06/15/23 175 lb (79.4 kg)  05/08/23 180 lb (81.6 kg)  04/25/23 176 lb (79.8 kg)       Physical Exam Abdominal:     General: Abdomen is flat. Bowel sounds are normal. There is no distension. There are no signs of injury.     Palpations: Abdomen is soft. There is no shifting dullness, fluid wave, hepatomegaly, mass or pulsatile mass.     Tenderness: There is no abdominal tenderness. There is no right CVA tenderness, left CVA tenderness, guarding or rebound.     Hernia: No hernia is present.     Comments: Abdominal striae       No results found for any visits on 06/15/23.  Assessment & Plan     Problem List Items Addressed This Visit   None Visit Diagnoses     Elevated liver enzymes    -  Primary   Relevant Orders   Comprehensive metabolic panel   Lipase   Abdominal discomfort       Relevant Orders   Lipase          Abdominal Discomfort and Excessive Gas Symptoms persist despite dietary changes. History of cholecystectomy. No alarming symptoms such as weight loss, anemia, or severe pain. -Consider low FODMAP diet to help with gas and bloating. -Check pancreatic enzymes to rule out pancreatitis. -Consider referral to gastroenterology if symptoms persist or worsen.patient would like to hold off on referral for now, she would like to avoid additional medication if possible   Elevated Liver Enzymes Mildly elevated ALT on previous labs. Patient has stopped all supplements to see if this was the cause. -Repeat complete metabolic panel to reassess liver enzymes.  Menopausal Symptoms Reports headaches and general aches and pains. -Continue  supportive care and lifestyle modifications.  General Health Maintenance -Complete second round of Shingles vaccine on or after July 08, 2023. -Pt will call to schedule  DEXA scan, previously completed at [facility], number given on AVS to call . -Verify date of last colonoscopy, pt will send letter via MyChart   Follow-up in November to reassess abdominal symptoms and review lab results.       Return in about 2 months (around 08/15/2023) for belching/bloating .       Ronnald Ramp, MD  [facility] [phone] (phone) [phone] (fax)  [facility]

## 2023-06-16 LAB — COMPREHENSIVE METABOLIC PANEL
ALT: 33 IU/L — ABNORMAL HIGH (ref 0–32)
AST: 20 IU/L (ref 0–40)
Albumin: 4.3 g/dL (ref 3.8–4.9)
Alkaline Phosphatase: 98 IU/L (ref 44–121)
BUN/Creatinine Ratio: 15 (ref 9–23)
BUN: 14 mg/dL (ref 6–24)
Bilirubin Total: 0.7 mg/dL (ref 0.0–1.2)
CO2: 23 mmol/L (ref 20–29)
Calcium: 9.7 mg/dL (ref 8.7–10.2)
Chloride: 107 mmol/L — ABNORMAL HIGH (ref 96–106)
Creatinine, Ser: 0.92 mg/dL (ref 0.57–1.00)
Globulin, Total: 2.2 g/dL (ref 1.5–4.5)
Glucose: 84 mg/dL (ref 70–99)
Potassium: 4 mmol/L (ref 3.5–5.2)
Sodium: 142 mmol/L (ref 134–144)
Total Protein: 6.5 g/dL (ref 6.0–8.5)
eGFR: 74 mL/min/{1.73_m2} (ref >59)

## 2023-06-16 LAB — LIPASE: Lipase: 17 U/L (ref 14–72)

## 2023-08-14 NOTE — Progress Notes (Unsigned)
      Established patient visit   Patient: Jennifer White   DOB: Jul 18, 1971   52 y.o. Female  MRN: [id number] Visit Date: 08/15/2023  Today's healthcare provider: Ronnald Ramp, MD   No chief complaint on file.  Subjective       Discussed the use of AI scribe software for clinical note transcription with the patient, who gave verbal consent to proceed.  History of Present Illness             Past Medical History:  Diagnosis Date   Anemia    Asthma    Endometriosis    Fibroid    Lactose intolerance    Vitamin D deficiency     Medications: Outpatient Medications Prior to Visit  Medication Sig   cholecalciferol (VITAMIN D) 1000 units tablet Take 1,000 Units by mouth daily. Take 5000 units daily (Patient not taking: Reported on 06/15/2023)   vitamin C (ASCORBIC ACID) 500 MG tablet Take 500 mg by mouth daily. (Patient not taking: Reported on 06/15/2023)   vitamin E 180 MG (400 UNITS) capsule Take 400 Units by mouth daily. (Patient not taking: Reported on 06/15/2023)   No facility-administered medications prior to visit.    Review of Systems  {Insert previous labs (optional):23779} {See past labs  Heme  Chem  Endocrine  Serology  Results Review (optional):1}   Objective    There were no vitals taken for this visit. {Insert last BP/Wt (optional):23777}{See vitals history (optional):1}      Physical Exam  ***  No results found for any visits on 08/15/23.  Assessment & Plan     Problem List Items Addressed This Visit   None   Assessment and Plan              No follow-ups on file.         Ronnald Ramp, MD  [facility] [phone] (phone) [phone] (fax)  [facility]

## 2023-08-15 ENCOUNTER — Ambulatory Visit: Payer: Managed Care, Other (non HMO) | Admitting: Family Medicine

## 2023-08-15 VITALS — BP 114/81 | HR 73 | Resp 16 | Ht 61.0 in | Wt 179.0 lb

## 2023-08-15 DIAGNOSIS — Z6832 Body mass index (BMI) 32.0-32.9, adult: Secondary | ICD-10-CM

## 2023-08-15 DIAGNOSIS — R140 Abdominal distension (gaseous): Secondary | ICD-10-CM

## 2023-08-15 DIAGNOSIS — N951 Menopausal and female climacteric states: Secondary | ICD-10-CM

## 2023-08-15 DIAGNOSIS — Z23 Encounter for immunization: Secondary | ICD-10-CM

## 2023-08-15 NOTE — Assessment & Plan Note (Signed)
Hot flashes, brain fog, and interrupted sleep. Hesitant about hormonal treatments. Discussed Veozah as a non-hormonal option with potential side effects (<5%): increased hot flashes, diarrhea, insomnia, back pain. Need to monitor liver enzymes if starting Veozah. - Consider Veozah for hot flashes, pt declines  - Monitor liver enzymes if starting [employer] 

## 2023-08-15 NOTE — Assessment & Plan Note (Signed)
  Bloating and Digestive Issues Persistent bloating, flatulence, and belching. Non-compliance with FODMAP diet due to stress and weight gain. Lipase levels normal. Discussed papaya for digestion. - Continue FODMAP diet - Consider papaya for digestion - Continue ginger, lemon, and garlic tea - Use prebiotic and probiotic

## 2023-08-15 NOTE — Assessment & Plan Note (Signed)
Weight gain due to stress and sedentary lifestyle. Discussed [employer] for accountability and virtual trainer for exercise. Emphasized regular movement during work hours. 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

## 2023-08-15 NOTE — Patient Instructions (Addendum)
  VISIT SUMMARY:  Today, we discussed your ongoing issues with bloating, weight management, and menopausal symptoms. We reviewed your current dietary and lifestyle habits and explored additional strategies to help manage your symptoms more effectively. We also addressed your concerns about the shingles vaccine and discussed options for managing menopausal symptoms without hormones.  YOUR PLAN:  -BLOATING AND DIGESTIVE ISSUES: Bloating is a feeling of fullness or swelling in the abdomen. We recommend continuing with the FODMAP diet, which helps reduce foods that can cause bloating. You can also try eating papaya for digestion and continue drinking ginger, lemon, and garlic tea. Additionally, using prebiotic and probiotic supplements may help improve your digestive health.  -WEIGHT MANAGEMENT: Weight management involves maintaining a healthy weight through diet and exercise. We discussed joining [employer] for support and accountability, and considering a virtual trainer to help with exercise. It's important to incorporate regular movement into your daily routine, such as walking, squats, or leg kicks every hour during work.  -MENOPAUSAL SYMPTOMS: Menopausal symptoms include hot flashes, brain fog, and interrupted sleep. We discussed Veozah, a non-hormonal treatment option for hot flashes, which may have side effects like increased hot flashes, diarrhea, insomnia, and back pain. If you decide to start Veozah, we will need to monitor your liver enzymes.  -GENERAL HEALTH MAINTENANCE: We discussed the importance of getting the shingles vaccine to prevent shingles, a painful rash caused by the varicella-zoster virus. Although you have concerns about Guillain-Barre syndrome, it is a rare condition that typically presents with ascending weakness. The benefits of the vaccine outweigh the risks.  INSTRUCTIONS:  Please follow up in 2 months for an accountability and progress check. Additionally, schedule  your physical exam in May.

## 2023-09-28 ENCOUNTER — Ambulatory Visit: Payer: Managed Care, Other (non HMO) | Admitting: Family Medicine

## 2024-02-14 ENCOUNTER — Ambulatory Visit: Payer: Self-pay | Admitting: Family Medicine

## 2024-02-14 ENCOUNTER — Encounter: Payer: Self-pay | Admitting: Family Medicine

## 2024-02-14 VITALS — BP 117/87 | HR 72 | Wt 181.0 lb

## 2024-02-14 DIAGNOSIS — E559 Vitamin D deficiency, unspecified: Secondary | ICD-10-CM

## 2024-02-14 DIAGNOSIS — M8580 Other specified disorders of bone density and structure, unspecified site: Secondary | ICD-10-CM | POA: Diagnosis not present

## 2024-02-14 DIAGNOSIS — Z0001 Encounter for general adult medical examination with abnormal findings: Secondary | ICD-10-CM

## 2024-02-14 DIAGNOSIS — N951 Menopausal and female climacteric states: Secondary | ICD-10-CM | POA: Insufficient documentation

## 2024-02-14 DIAGNOSIS — Z131 Encounter for screening for diabetes mellitus: Secondary | ICD-10-CM

## 2024-02-14 DIAGNOSIS — Z130 Encounter for screening for diseases of the blood and blood-forming organs and certain disorders involving the immune mechanism: Secondary | ICD-10-CM

## 2024-02-14 DIAGNOSIS — Z6834 Body mass index (BMI) 34.0-34.9, adult: Secondary | ICD-10-CM

## 2024-02-14 DIAGNOSIS — Z780 Asymptomatic menopausal state: Secondary | ICD-10-CM

## 2024-02-14 DIAGNOSIS — Z114 Encounter for screening for human immunodeficiency virus [HIV]: Secondary | ICD-10-CM

## 2024-02-14 DIAGNOSIS — R6889 Other general symptoms and signs: Secondary | ICD-10-CM

## 2024-02-14 DIAGNOSIS — E785 Hyperlipidemia, unspecified: Secondary | ICD-10-CM | POA: Diagnosis not present

## 2024-02-14 DIAGNOSIS — Z0000 Encounter for general adult medical examination without abnormal findings: Secondary | ICD-10-CM | POA: Insufficient documentation

## 2024-02-14 NOTE — Progress Notes (Signed)
 Complete physical exam   Patient: Jennifer White   DOB: 03-Oct-1970   53 y.o. Female  MRN: [id number] Visit Date: 02/14/2024  Today's healthcare provider: Mimi Alt, MD   Chief Complaint  Patient presents with   Annual Exam   Care Management    Pattern of eating  General  Are you exercising :yes  What exercising:walking  How long:   How frequent: everyday      Subjective    Jennifer White is a 54 y.o. female who presents today for a complete physical exam.     She does not have additional problems to discuss today.   Discussed the use of AI scribe software for clinical note transcription with the patient, who gave verbal consent to proceed.  History of Present Illness Jennifer White is a 53 year old female who presents for an annual physical exam and concerns related to menopause and weight management.  She is experiencing challenges with weight management, attributing it to menopause and her current lifestyle. Her weight fluctuates, and she finds it difficult to achieve significant weight loss despite efforts to eat better and exercise regularly. Weight only decreases significantly when she is ill and unable to eat. She has been in menopause for approximately six years and is concerned about cortisol levels affecting her weight, particularly around her waist. She is interested in having her cortisol levels checked as part of her lab work today.  She is balancing a busy schedule with work and school, impacting her ability to maintain a consistent exercise routine. She has been walking for thirty minutes daily since the semester started and is trying to incorporate exercise in short spurts throughout her day. She is focusing on meal preparation to ensure appropriate nutrition.  She experiences menopausal symptoms, including sleep disturbances, and has been dealing with these symptoms for several years. She is taking vitamin D  supplements but  not calcium due to constipation issues. Her last bone density test indicated osteopenia.  She has a history of pre-diabetes with an A1c of 5.8 in August of the previous year and is concerned about her blood sugar levels, especially with the intake of supplements containing sugar.  She has a history of lymphedema, which she associates with swelling in her legs. She is unsure if this is related to menopause or other factors and describes her legs as having changed over time.   Flowsheet Row Office Visit from 02/14/2024 in [facility]  PHQ-9 Total Score 6        Past Medical History:  Diagnosis Date   Anemia    Asthma    Endometriosis    Fibroid    Lactose intolerance    Vitamin D  deficiency    Past Surgical History:  Procedure Laterality Date   CESAREAN SECTION     1993/2002   CHOLECYSTECTOMY, LAPAROSCOPIC  2004   COLONOSCOPY  2011   normal   COLONOSCOPY  03/20/2019   DILATION AND CURETTAGE OF UTERUS  1988   septic AB/PID/+GC   LAPAROSCOPIC SUPRACERVICAL HYSTERECTOMY  09/24/2013   and bilateral salpingectomy, minilaparotomy and cystoscopy   OOPHORECTOMY  10/28/2013   left for ovarian torsion   Social History   Socioeconomic History   Marital status: Married    Spouse name: Not on file   Number of children: 2   Years of education: Not on file   Highest education level: Bachelor's degree (e.g., BA, AB, BS)  Occupational History  Occupation: Engineer, manufacturing  Tobacco Use   Smoking status: Never   Smokeless tobacco: Never  Vaping Use   Vaping status: Never Used  Substance and Sexual Activity   Alcohol use: No   Drug use: No   Sexual activity: Yes    Partners: Male    Birth control/protection: Surgical    Comment: Hysterectomy  Other Topics Concern   Not on file  Social History Narrative   Not on file   Social Drivers of Health   Financial Resource Strain: Low Risk  (08/15/2023)   Overall Financial Resource Strain (CARDIA)     Difficulty of Paying Living Expenses: Not hard at all  Food Insecurity: No Food Insecurity (08/15/2023)   Hunger Vital Sign    Worried About Running Out of Food in the Last Year: Never true    Ran Out of Food in the Last Year: Never true  Transportation Needs: No Transportation Needs (08/15/2023)   PRAPARE - Administrator, Civil Service (Medical): No    Lack of Transportation (Non-Medical): No  Physical Activity: Insufficiently Active (08/15/2023)   Exercise Vital Sign    Days of Exercise per Week: 3 days    Minutes of Exercise per Session: 30 min  Stress: No Stress Concern Present (08/15/2023)   [employer] of Occupational Health - Occupational Stress Questionnaire    Feeling of Stress : Not at all  Social Connections: Socially Integrated (08/15/2023)   Social Connection and Isolation Panel [NHANES]    Frequency of Communication with Friends and Family: More than three times a week    Frequency of Social Gatherings with Friends and Family: More than three times a week    Attends Religious Services: More than 4 times per year    Active Member of [employer] or Organizations: Yes    Attends Engineer, structural: More than 4 times per year    Marital Status: Married  Catering manager Violence: Not on file   Family Status  Relation Name Status   Mother  Alive   Father  Alive   MGM  Deceased   MGF  Deceased   PGM  Deceased   PGF  Deceased   Neg Hx  (Not Specified)  No partnership data on file   Family History  Problem Relation Age of Onset   Colon cancer Mother 73   Arthritis Mother    Hypertension Father    Lung cancer Father 47   Atrial fibrillation Father    Colon cancer Paternal Grandmother 56       had a blockage but unsure if colon cancer or stomach cancer   Diabetes Paternal Grandmother        amputee   Hypertension Paternal Grandmother    Prostate cancer Paternal Grandfather    Breast cancer Neg Hx    Allergies  Allergen Reactions    Ciprofloxacin      Leg cramping and mental status changes     Medications: Outpatient Medications Prior to Visit  Medication Sig   Multiple Vitamin (MULTIVITAMIN) capsule Take 1 capsule by mouth daily. Taking every few days   cholecalciferol (VITAMIN D ) 1000 units tablet Take 1,000 Units by mouth daily. Take 5000 units daily (Patient not taking: Reported on 02/14/2024)   vitamin C (ASCORBIC ACID) 500 MG tablet Take 500 mg by mouth daily. (Patient not taking: Reported on 02/14/2024)   vitamin E 180 MG (400 UNITS) capsule Take 400 Units by mouth daily. (Patient not taking: Reported on 02/14/2024)  No facility-administered medications prior to visit.    Review of Systems  Last CBC Lab Results  Component Value Date   WBC 5.8 02/14/2024   HGB 13.2 02/14/2024   HCT 41.0 02/14/2024   MCV 92 02/14/2024   MCH 29.5 02/14/2024   RDW 13.2 02/14/2024   PLT 206 02/14/2024   Last metabolic panel Lab Results  Component Value Date   GLUCOSE 88 02/14/2024   NA 144 02/14/2024   K 4.0 02/14/2024   CL 107 (H) 02/14/2024   CO2 21 02/14/2024   BUN 18 02/14/2024   CREATININE 0.94 02/14/2024   EGFR 72 02/14/2024   CALCIUM 9.4 02/14/2024   PROT 6.6 02/14/2024   ALBUMIN 4.3 02/14/2024   LABGLOB 2.3 02/14/2024   AGRATIO 1.7 01/12/2021   BILITOT 0.6 02/14/2024   ALKPHOS 105 02/14/2024   AST 19 02/14/2024   ALT 23 02/14/2024   ANIONGAP 9 11/06/2013   Last lipids Lab Results  Component Value Date   CHOL 187 02/14/2024   HDL 60 02/14/2024   LDLCALC 116 (H) 02/14/2024   TRIG 60 02/14/2024   CHOLHDL 3.1 02/14/2024   The ASCVD Risk score (Arnett DK, et al., 2019) failed to calculate for the following reasons:   Unable to determine if patient is Non-Hispanic African American  Last hemoglobin A1c Lab Results  Component Value Date   HGBA1C 5.6 02/14/2024   Last thyroid functions Lab Results  Component Value Date   TSH 2.620 05/08/2023   T3TOTAL 99 03/30/2020   Last vitamin D  Lab  Results  Component Value Date   VD25OH 41.2 02/14/2024   Last vitamin B12 and Folate Lab Results  Component Value Date   VITAMINB12 562 05/08/2023   FOLATE 12.2 05/08/2023       Objective    BP 117/87   Pulse 72   Wt 181 lb (82.1 kg)   SpO2 100%   BMI 34.20 kg/m  BP Readings from Last 3 Encounters:  02/14/24 117/87  08/15/23 114/81  06/15/23 105/83   Wt Readings from Last 3 Encounters:  02/14/24 181 lb (82.1 kg)  08/15/23 179 lb (81.2 kg)  06/15/23 175 lb (79.4 kg)        Physical Exam Vitals reviewed.  Constitutional:      General: She is not in acute distress.    Appearance: Normal appearance. She is not ill-appearing, toxic-appearing or diaphoretic.  HENT:     Head: Normocephalic and atraumatic.     Right Ear: Tympanic membrane and external ear normal. There is no impacted cerumen.     Left Ear: Tympanic membrane and external ear normal. There is no impacted cerumen.     Nose: Nose normal.     Mouth/Throat:     Pharynx: Oropharynx is clear.  Eyes:     General: No scleral icterus.    Extraocular Movements: Extraocular movements intact.     Conjunctiva/sclera: Conjunctivae normal.     Pupils: Pupils are equal, round, and reactive to light.  Cardiovascular:     Rate and Rhythm: Normal rate and regular rhythm.     Pulses: Normal pulses.     Heart sounds: Normal heart sounds. No murmur heard.    No friction rub. No gallop.  Pulmonary:     Effort: Pulmonary effort is normal. No respiratory distress.     Breath sounds: Normal breath sounds. No wheezing, rhonchi or rales.  Abdominal:     General: Bowel sounds are normal. There is no distension.  Palpations: Abdomen is soft. There is no mass.     Tenderness: There is no abdominal tenderness. There is no guarding.  Musculoskeletal:        General: No deformity.     Cervical back: Normal range of motion and neck supple.     Right lower leg: No edema.     Left lower leg: No edema.  Lymphadenopathy:      Cervical: No cervical adenopathy.  Skin:    General: Skin is warm.     Capillary Refill: Capillary refill takes less than 2 seconds.     Findings: No erythema or rash.  Neurological:     General: No focal deficit present.     Mental Status: She is alert and oriented to person, place, and time.     Cranial Nerves: Cranial nerves 2-12 are intact. No cranial nerve deficit or facial asymmetry.     Motor: Motor function is intact. No weakness.     Gait: Gait normal.  Psychiatric:        Mood and Affect: Mood normal.        Behavior: Behavior normal.      Last depression screening scores    02/14/2024    9:27 AM 08/15/2023   10:14 AM 06/15/2023    9:38 AM  PHQ 2/9 Scores  PHQ - 2 Score 0 0 0  PHQ- 9 Score 6 0 0    Last fall risk screening    08/15/2023   10:14 AM  Fall Risk   Falls in the past year? 0  Number falls in past yr: 0  Injury with Fall? 0    Last Audit-C alcohol use screening    08/15/2023    7:18 AM  Alcohol Use Disorder Test (AUDIT)  1. How often do you have a drink containing alcohol? 0  3. How often do you have six or more drinks on one occasion? 0   A score of 3 or more in women, and 4 or more in men indicates increased risk for alcohol abuse, EXCEPT if all of the points are from question 1   Results for orders placed or performed in visit on 02/14/24  VITAMIN D  25 Hydroxy (Vit-D Deficiency, Fractures)  Result Value Ref Range   Vit D, 25-Hydroxy 41.2 30.0 - 100.0 ng/mL  Lipid panel  Result Value Ref Range   Cholesterol, Total 187 100 - 199 mg/dL   Triglycerides 60 0 - 149 mg/dL   HDL 60 >25 mg/dL   VLDL Cholesterol Cal 11 5 - 40 mg/dL   LDL Chol Calc (NIH) 366 (H) 0 - 99 mg/dL   Chol/HDL Ratio 3.1 0.0 - 4.4 ratio  Hemoglobin A1c  Result Value Ref Range   Hgb A1c MFr Bld 5.6 4.8 - 5.6 %   Est. average glucose Bld gHb Est-mCnc 114 mg/dL  CBC  Result Value Ref Range   WBC 5.8 3.4 - 10.8 x10E3/uL   RBC 4.48 3.77 - 5.28 x10E6/uL   Hemoglobin 13.2  11.1 - 15.9 g/dL   Hematocrit 44.0 34.7 - 46.6 %   MCV 92 79 - 97 fL   MCH 29.5 26.6 - 33.0 pg   MCHC 32.2 31.5 - 35.7 g/dL   RDW 42.5 95.6 - 38.7 %   Platelets 206 150 - 450 x10E3/uL  CMP14+EGFR  Result Value Ref Range   Glucose 88 70 - 99 mg/dL   BUN 18 6 - 24 mg/dL   Creatinine, Ser 5.64 0.57 - 1.00 mg/dL  eGFR 72 >59 mL/min/1.73   BUN/Creatinine Ratio 19 9 - 23   Sodium 144 134 - 144 mmol/L   Potassium 4.0 3.5 - 5.2 mmol/L   Chloride 107 (H) 96 - 106 mmol/L   CO2 21 20 - 29 mmol/L   Calcium 9.4 8.7 - 10.2 mg/dL   Total Protein 6.6 6.0 - 8.5 g/dL   Albumin 4.3 3.8 - 4.9 g/dL   Globulin, Total 2.3 1.5 - 4.5 g/dL   Bilirubin Total 0.6 0.0 - 1.2 mg/dL   Alkaline Phosphatase 105 44 - 121 IU/L   AST 19 0 - 40 IU/L   ALT 23 0 - 32 IU/L  Cortisol  Result Value Ref Range   Cortisol 6.8 6.2 - 19.4 ug/dL    Assessment & Plan    Routine Health Maintenance and Physical Exam  Immunization History  Administered Date(s) Administered   MMR 07/01/2018   PFIZER(Purple Top)SARS-COV-2 Vaccination 01/12/2020, 02/04/2020   Zoster Recombinant(Shingrix ) 05/08/2023, 08/15/2023    Health Maintenance  Topic Date Due   DTaP/Tdap/Td (1 - Tdap) Never done   COVID-19 Vaccine (3 - 2024-25 season) 05/27/2023   HIV Screening  02/13/2025 (Originally 02/09/1986)   INFLUENZA VACCINE  04/25/2024   MAMMOGRAM  05/10/2025   Cervical Cancer Screening (HPV/Pap Cotest)  03/25/2026   Colonoscopy  01/23/2029   Zoster Vaccines- Shingrix   Completed   HPV VACCINES  Aged Out   Meningococcal B Vaccine  Aged Out   Hepatitis C Screening  Discontinued    Problem List Items Addressed This Visit       Cardiovascular and Mediastinum   Perimenopausal vasomotor symptoms     Musculoskeletal and Integument   Osteopenia after menopause   Relevant Orders   Cortisol (Completed)     Other   Vitamin D  deficiency   Relevant Orders   VITAMIN D  25 Hydroxy (Vit-D Deficiency, Fractures) (Completed)   Cortisol  (Completed)   Menopausal symptoms   Hyperlipidemia LDL goal <100   Relevant Orders   Lipid panel (Completed)   Cortisol (Completed)   BMI 32.0-32.9,adult   Annual physical exam - Primary   Other Visit Diagnoses       Screening for deficiency anemia       Relevant Orders   CBC (Completed)   Cortisol (Completed)     Screening for diabetes mellitus       Relevant Orders   Cortisol (Completed)     Screening for HIV (human immunodeficiency virus)       Relevant Orders   Cortisol (Completed)     Fluctuation of weight       Relevant Orders   Cortisol (Completed)        Assessment & Plan Menopausal symptoms Menopausal symptoms ongoing for six years, including weight fluctuation, sleep disturbances, and difficulty with weight loss despite lifestyle modifications. She is considering cortisol level testing to evaluate its role in weight management. - Order cortisol level - Discuss lifestyle modifications including exercise and diet - Encourage intermittent fasting and meal prepping  Pre-diabetes A1c of 5.8% indicating pre-diabetes. She is managing diet and exercise and is not interested in weight loss medications. Considering functional medicine consultation for comprehensive evaluation. - Order A1c - Encourage meal prepping and regular exercise - Discuss potential referral to functional medicine  Osteopenia Osteopenia with low bone mass and lower back pain. Calcium supplementation is not tolerated due to constipation. Advised to prevent progression to osteoporosis. - Order bone density scan - Encourage dietary calcium intake - Continue vitamin D   supplementation  General Health Maintenance Due for COVID vaccine and tetanus booster. Considering COVID booster and will verify tetanus vaccination status. - Recommend COVID vaccine booster - Verify tetanus vaccination status and administer booster if needed       Return in about 1 year (around 02/13/2025) for CPE.        Mimi Alt, MD  [facility] [phone] (phone) [phone] (fax)  [facility]

## 2024-02-15 ENCOUNTER — Ambulatory Visit: Payer: Self-pay | Admitting: Family Medicine

## 2024-02-15 LAB — CBC
Hematocrit: 41 % (ref 34.0–46.6)
Hemoglobin: 13.2 g/dL (ref 11.1–15.9)
MCH: 29.5 pg (ref 26.6–33.0)
MCHC: 32.2 g/dL (ref 31.5–35.7)
MCV: 92 fL (ref 79–97)
Platelets: 206 10*3/uL (ref 150–450)
RBC: 4.48 x10E6/uL (ref 3.77–5.28)
RDW: 13.2 % (ref 11.7–15.4)
WBC: 5.8 10*3/uL (ref 3.4–10.8)

## 2024-02-15 LAB — LIPID PANEL
Chol/HDL Ratio: 3.1 ratio (ref 0.0–4.4)
Cholesterol, Total: 187 mg/dL (ref 100–199)
HDL: 60 mg/dL (ref >39)
LDL Chol Calc (NIH): 116 mg/dL — ABNORMAL HIGH (ref 0–99)
Triglycerides: 60 mg/dL (ref 0–149)
VLDL Cholesterol Cal: 11 mg/dL (ref 5–40)

## 2024-02-15 LAB — CMP14+EGFR
ALT: 23 IU/L (ref 0–32)
AST: 19 IU/L (ref 0–40)
Albumin: 4.3 g/dL (ref 3.8–4.9)
Alkaline Phosphatase: 105 IU/L (ref 44–121)
BUN/Creatinine Ratio: 19 (ref 9–23)
BUN: 18 mg/dL (ref 6–24)
Bilirubin Total: 0.6 mg/dL (ref 0.0–1.2)
CO2: 21 mmol/L (ref 20–29)
Calcium: 9.4 mg/dL (ref 8.7–10.2)
Chloride: 107 mmol/L — ABNORMAL HIGH (ref 96–106)
Creatinine, Ser: 0.94 mg/dL (ref 0.57–1.00)
Globulin, Total: 2.3 g/dL (ref 1.5–4.5)
Glucose: 88 mg/dL (ref 70–99)
Potassium: 4 mmol/L (ref 3.5–5.2)
Sodium: 144 mmol/L (ref 134–144)
Total Protein: 6.6 g/dL (ref 6.0–8.5)
eGFR: 72 mL/min/{1.73_m2} (ref >59)

## 2024-02-15 LAB — CORTISOL: Cortisol: 6.8 ug/dL (ref 6.2–19.4)

## 2024-02-15 LAB — HEMOGLOBIN A1C
Est. average glucose Bld gHb Est-mCnc: 114 mg/dL
Hgb A1c MFr Bld: 5.6 % (ref 4.8–5.6)

## 2024-02-15 LAB — VITAMIN D 25 HYDROXY (VIT D DEFICIENCY, FRACTURES): Vit D, 25-Hydroxy: 41.2 ng/mL (ref 30.0–100.0)

## 2024-03-04 NOTE — Telephone Encounter (Signed)
 Copied from CRM [id number]. Topic: Referral - Request for Referral >> Mar 04, 2024  1:31 PM Phil Braun wrote: Did the patient discuss referral with their provider in the last year? Yes (If No - schedule appointment) (If Yes - send message)  Appointment offered? Yes  Type of order/referral and detailed reason for visit: Bone density   Preference of office, provider, location: [place]  If referral order, have you been seen by this specialty before? Yes [employer] (If Yes, this issue or another issue? When? Where?  Can we respond through MyChart? Yes

## 2024-03-05 NOTE — Addendum Note (Signed)
 Addended by: SIMMONS-ROBINSON, Isaiahs Chancy L on: 03/05/2024 07:30 AM   Modules accepted: Orders

## 2024-03-14 ENCOUNTER — Ambulatory Visit
Admission: RE | Admit: 2024-03-14 | Discharge: 2024-03-14 | Disposition: A | Discharge status: Home / Self Care | Source: Ambulatory Visit | Attending: Family Medicine | Admitting: Family Medicine

## 2024-03-14 DIAGNOSIS — Z780 Asymptomatic menopausal state: Secondary | ICD-10-CM | POA: Diagnosis present

## 2024-03-14 DIAGNOSIS — M8580 Other specified disorders of bone density and structure, unspecified site: Secondary | ICD-10-CM | POA: Diagnosis present

## 2024-04-03 ENCOUNTER — Ambulatory Visit: Admitting: Plastic Surgery

## 2024-04-03 VITALS — BP 120/79 | HR 71 | Ht 60.5 in | Wt 184.6 lb

## 2024-04-03 DIAGNOSIS — N62 Hypertrophy of breast: Secondary | ICD-10-CM

## 2024-04-03 DIAGNOSIS — M546 Pain in thoracic spine: Secondary | ICD-10-CM

## 2024-04-03 DIAGNOSIS — M542 Cervicalgia: Secondary | ICD-10-CM | POA: Diagnosis not present

## 2024-04-03 DIAGNOSIS — G8929 Other chronic pain: Secondary | ICD-10-CM

## 2024-04-03 DIAGNOSIS — M549 Dorsalgia, unspecified: Secondary | ICD-10-CM | POA: Insufficient documentation

## 2024-04-03 NOTE — Progress Notes (Signed)
 Patient ID: Jennifer White, female    DOB: 11-11-1970, 53 y.o.   MRN: [id number]   Chief Complaint  Patient presents with   Advice Only    Mammary Hyperplasia: The patient is a 53 y.o. female with a history of mammary hyperplasia for several years.  She has extremely large breasts causing symptoms that include the following: Back pain in the upper and lower back, including neck pain. She pulls or pins her bra straps to provide better lift and relief of the pressure and pain. She notices relief by holding her breast up manually.  Her shoulder straps cause grooves and pain and pressure that requires padding for relief. Pain medication is sometimes required with motrin and tylenol.  Activities that are hindered by enlarged breasts include: exercise and running.  She has tried supportive clothing as well as fitted bras without improvement.  Her breasts are extremely large and fairly symmetric.  She has hyperpigmentation of the inframammary area on both sides.  The sternal to nipple distance on the right is 36 cm and the left is 38 cm.  The IMF distance is 17 cm.  She is 5 feet tall and weighs 184 pounds.  The BMI = 35.4 kg/m.  Preoperative bra size = E cup.  The estimated excess breast tissue to be removed at the time of surgery = 400450 grams on the left and 400-450 grams on the right.  Mammogram history: 2024 negative, due in August.  Tobacco use:  none.   The patient expresses the desire to pursue surgical intervention.     Review of Systems  Constitutional:  Positive for activity change. Negative for appetite change.  Eyes: Negative.   Respiratory: Negative.    Cardiovascular: Negative.   Gastrointestinal: Negative.   Endocrine: Negative.   Genitourinary: Negative.   Musculoskeletal:  Positive for back pain and neck pain.  Skin:  Positive for rash.    Past Medical History:  Diagnosis Date   Anemia    Asthma    Endometriosis    Fibroid    Lactose intolerance    Vitamin D   deficiency     Past Surgical History:  Procedure Laterality Date   CESAREAN SECTION     1993/2002   CHOLECYSTECTOMY, LAPAROSCOPIC  2004   COLONOSCOPY  2011   normal   COLONOSCOPY  03/20/2019   DILATION AND CURETTAGE OF UTERUS  1988   septic AB/PID/+GC   LAPAROSCOPIC SUPRACERVICAL HYSTERECTOMY  09/24/2013   and bilateral salpingectomy, minilaparotomy and cystoscopy   OOPHORECTOMY  10/28/2013   left for ovarian torsion      Current Outpatient Medications:    cholecalciferol (VITAMIN D ) 1000 units tablet, Take 1,000 Units by mouth daily. Take 5000 units daily (Patient not taking: Reported on 04/03/2024), Disp: , Rfl:    Multiple Vitamin (MULTIVITAMIN) capsule, Take 1 capsule by mouth daily. Taking every few days (Patient not taking: Reported on 04/03/2024), Disp: , Rfl:    vitamin C (ASCORBIC ACID) 500 MG tablet, Take 500 mg by mouth daily. (Patient not taking: Reported on 04/03/2024), Disp: , Rfl:    vitamin E 180 MG (400 UNITS) capsule, Take 400 Units by mouth daily. (Patient not taking: Reported on 04/03/2024), Disp: , Rfl:    Objective:   Vitals:   04/03/24 1230  BP: 120/79  Pulse: 71  SpO2: 99%    Physical Exam Vitals reviewed.  Constitutional:      Appearance: Normal appearance.  Cardiovascular:     Rate and  Rhythm: Normal rate.     Pulses: Normal pulses.  Pulmonary:     Effort: Pulmonary effort is normal.  Abdominal:     Palpations: Abdomen is soft.  Musculoskeletal:        General: No swelling or deformity.  Skin:    General: Skin is warm.     Capillary Refill: Capillary refill takes less than 2 seconds.  Neurological:     Mental Status: She is alert and oriented to person, place, and time.  Psychiatric:        Mood and Affect: Mood normal.        Behavior: Behavior normal.        Thought Content: Thought content normal.        Judgment: Judgment normal.     Assessment & Plan:  Chronic bilateral thoracic back pain  Symptomatic mammary hypertrophy  The  procedure the patient selected and that was best for the patient was discussed. The risk were discussed and include but not limited to the following:  Breast asymmetry, fluid accumulation, firmness of the breast, inability to breast feed, loss of nipple or areola, skin loss, change in skin and nipple sensation, fat necrosis of the breast tissue, bleeding, infection and healing delay.  There are risks of anesthesia and injury to nerves or blood vessels.  Allergic reaction to tape, suture and skin glue are possible.  There will be swelling.  Any of these can lead to the need for revisional surgery which is not included in this surgery.  A breast reduction has potential to interfere with diagnostic procedures in the future.  This procedure is best done when the breast is fully developed.  Changes in the breast will continue to occur over time: pregnancy, weight gain or weigh loss. No guarantees are given for a certain bra or breast size.    Total time: 40 minutes. This includes time spent with the patient during the visit as well as time spent before and after the visit reviewing the chart, documenting the encounter, ordering pertinent studies and literature for the patient.    Patient is a good candidate for bilateral breast reduction with liposuction.  Pictures were obtained of the patient and placed in the chart with the patient's or guardian's permission.   Estefana RAMAN Alaiya Martindelcampo, DO

## 2024-04-15 ENCOUNTER — Other Ambulatory Visit: Payer: Self-pay | Admitting: Family Medicine

## 2024-04-15 DIAGNOSIS — Z1231 Encounter for screening mammogram for malignant neoplasm of breast: Secondary | ICD-10-CM

## 2024-05-16 ENCOUNTER — Ambulatory Visit
Admission: RE | Admit: 2024-05-16 | Discharge: 2024-05-16 | Disposition: A | Discharge status: Home / Self Care | Source: Ambulatory Visit | Attending: Family Medicine | Admitting: Family Medicine

## 2024-05-16 DIAGNOSIS — Z1231 Encounter for screening mammogram for malignant neoplasm of breast: Secondary | ICD-10-CM | POA: Insufficient documentation

## 2024-05-20 ENCOUNTER — Other Ambulatory Visit: Payer: Self-pay | Admitting: Family Medicine

## 2024-05-20 DIAGNOSIS — R928 Other abnormal and inconclusive findings on diagnostic imaging of breast: Secondary | ICD-10-CM

## 2024-05-20 NOTE — Telephone Encounter (Unsigned)
 Copied from CRM #[id number]. Topic: General - Other >> May 20, 2024  2:14 PM Fonda T wrote: Reason for CRM: Received call from patient, states she received a mychart message from PCP office, requiring a list of tests that needs to be done prior to preop for surgery, breast reduction, which preop appointment is scheduled on 05/29/24 per patient.   Patient is confused on what this message means, and requesting a return call to inquire on the message received.  Per chart review, only seen mychart message from plastic surgery.  Patient can be reached at [phone] to discuss further.   Patient is aware of same day call back.  Thanks

## 2024-05-22 ENCOUNTER — Ambulatory Visit
Admission: RE | Admit: 2024-05-22 | Discharge: 2024-05-22 | Disposition: A | Discharge status: Home / Self Care | Source: Ambulatory Visit | Attending: Family Medicine | Admitting: Family Medicine

## 2024-05-22 DIAGNOSIS — R928 Other abnormal and inconclusive findings on diagnostic imaging of breast: Secondary | ICD-10-CM | POA: Insufficient documentation

## 2024-05-28 ENCOUNTER — Encounter: Admitting: Surgical

## 2024-05-28 NOTE — Progress Notes (Unsigned)
 Patient ID: Jennifer White, female    DOB: October 27, 1970, 53 y.o.   MRN: [id number]  No chief complaint on file.   No diagnosis found.   History of Present Illness: Jennifer White is a 53 y.o.  female  with a history of macromastia.  She presents for preoperative evaluation for upcoming procedure, bilateral breast reduction with possible liposuction, scheduled for 06/19/2024 with Dr. Lowery.  The patient {HAS HAS [id number]} had problems with anesthesia. ***  Summary of Previous Visit: She was seen for initial consult 04/03/2024.  At that time, complained of chronic upper back and neck discomfort in the context of large breasts.  STN 36 cm on the right, 38 cm on the left.  Preoperative bra size equals E cup.  BMI equaled 35.4 kg/m.  Estimated tissue to be removed at time of surgery equals 400-450 g each side.  Job: ***  PMH Significant for: Macromastia   Past Medical History: Allergies: Allergies  Allergen Reactions   Ciprofloxacin      Leg cramping and mental status changes    Current Medications:  Current Outpatient Medications:    cholecalciferol (VITAMIN D ) 1000 units tablet, Take 1,000 Units by mouth daily. Take 5000 units daily (Patient not taking: Reported on 04/03/2024), Disp: , Rfl:    Multiple Vitamin (MULTIVITAMIN) capsule, Take 1 capsule by mouth daily. Taking every few days (Patient not taking: Reported on 04/03/2024), Disp: , Rfl:    vitamin C (ASCORBIC ACID) 500 MG tablet, Take 500 mg by mouth daily. (Patient not taking: Reported on 04/03/2024), Disp: , Rfl:    vitamin E 180 MG (400 UNITS) capsule, Take 400 Units by mouth daily. (Patient not taking: Reported on 04/03/2024), Disp: , Rfl:   Past Medical Problems: Past Medical History:  Diagnosis Date   Anemia    Asthma    Endometriosis    Fibroid    Lactose intolerance    Vitamin D  deficiency     Past Surgical History: Past Surgical History:  Procedure Laterality Date   CESAREAN SECTION      1993/2002   CHOLECYSTECTOMY, LAPAROSCOPIC  2004   COLONOSCOPY  2011   normal   COLONOSCOPY  03/20/2019   DILATION AND CURETTAGE OF UTERUS  1988   septic AB/PID/+GC   LAPAROSCOPIC SUPRACERVICAL HYSTERECTOMY  09/24/2013   and bilateral salpingectomy, minilaparotomy and cystoscopy   OOPHORECTOMY  10/28/2013   left for ovarian torsion    Social History: Social History   Socioeconomic History   Marital status: Married    Spouse name: Not on file   Number of children: 2   Years of education: Not on file   Highest education level: Bachelor's degree (e.g., BA, AB, BS)  Occupational History   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  Tobacco Use   Smoking status: Never   Smokeless tobacco: Never  Vaping Use   Vaping status: Never Used  Substance and Sexual Activity   Alcohol use: No   Drug use: No   Sexual activity: Yes    Partners: Male    Birth control/protection: Surgical    Comment: Hysterectomy  Other Topics Concern   Not on file  Social History Narrative   Not on file   Social Drivers of Health   Financial Resource Strain: Low Risk  (08/15/2023)   Overall Financial Resource Strain (CARDIA)    Difficulty of Paying Living Expenses: Not hard at all  Food Insecurity: No Food Insecurity (08/15/2023)   Hunger Vital Sign    Worried  About Running Out of Food in the Last Year: Never true    Ran Out of Food in the Last Year: Never true  Transportation Needs: No Transportation Needs (08/15/2023)   PRAPARE - Administrator, Civil Service (Medical): No    Lack of Transportation (Non-Medical): No  Physical Activity: Insufficiently Active (08/15/2023)   Exercise Vital Sign    Days of Exercise per Week: 3 days    Minutes of Exercise per Session: 30 min  Stress: No Stress Concern Present (08/15/2023)   [employer] of Occupational Health - Occupational Stress Questionnaire    Feeling of Stress : Not at all  Social Connections: Socially Integrated (08/15/2023)    Social Connection and Isolation Panel    Frequency of Communication with Friends and Family: More than three times a week    Frequency of Social Gatherings with Friends and Family: More than three times a week    Attends Religious Services: More than 4 times per year    Active Member of [employer] or Organizations: Yes    Attends Engineer, structural: More than 4 times per year    Marital Status: Married  Catering manager Violence: Not on file    Family History: Family History  Problem Relation Age of Onset   Colon cancer Mother 73   Arthritis Mother    Hypertension Father    Lung cancer Father 72   Atrial fibrillation Father    Colon cancer Paternal Grandmother 41       had a blockage but unsure if colon cancer or stomach cancer   Diabetes Paternal Grandmother        amputee   Hypertension Paternal Grandmother    Prostate cancer Paternal Grandfather    Breast cancer Neg Hx     Review of Systems: ROS  Physical Exam: Vital Signs There were no vitals taken for this visit.  Physical Exam *** Constitutional:      General: Not in acute distress.    Appearance: Normal appearance. Not ill-appearing.  HENT:     Head: Normocephalic and atraumatic.  Eyes:     Pupils: Pupils are equal, round. Cardiovascular:     Rate and Rhythm: Normal rate.    Pulses: Normal pulses.  Pulmonary:     Effort: No respiratory distress or increased work of breathing.  Speaks in full sentences. Abdominal:     General: Abdomen is flat. No distension.   Musculoskeletal: Normal range of motion. No lower extremity swelling or edema. No varicosities. *** Skin:    General: Skin is warm and dry.     Findings: No erythema or rash.  Neurological:     Mental Status: Alert and oriented to person, place, and time.  Psychiatric:        Mood and Affect: Mood normal.        Behavior: Behavior normal.    Assessment/Plan: The patient is scheduled for bilateral breast reduction with possible liposuction  with Dr. Lowery.  Risks, benefits, and alternatives of procedure discussed, questions answered and consent obtained.    Smoking Status: ***; Counseling Given? ***  Last Mammogram: 04/2024; Results: BI-RADS Category 1: Negative.  Caprini Score: ***; Risk Factors include: ***, BMI *** 25, and length of planned surgery. Recommendation for mechanical *** pharmacological prophylaxis. Encourage early ambulation.   Pictures obtained: 04/03/2024  Post-op Rx sent to pharmacy: ***  Patient was provided with the *** General Surgical Risk consent document and Pain Medication Agreement prior to their appointment.  They  had adequate time to read through the risk consent documents and Pain Medication Agreement. We also discussed them in person together during this preop appointment. All of their questions were answered to their satisfaction.  Recommended calling if they have any further questions.  Risk consent form and Pain Medication Agreement to be scanned into patient's chart.  The risk that can be encountered with breast reduction were discussed and include the following but not limited to these:  Breast asymmetry, fluid accumulation, firmness of the breast, inability to breast feed, loss of nipple or areola, skin loss, decrease or no nipple sensation, fat necrosis of the breast tissue, bleeding, infection, healing delay.  There are risks of anesthesia, changes to skin sensation and injury to nerves or blood vessels.  The muscle can be temporarily or permanently injured.  You may have an allergic reaction to tape, suture, glue, blood products which can result in skin discoloration, swelling, pain, skin lesions, poor healing.  Any of these can lead to the need for revisonal surgery or stage procedures.  A reduction has potential to interfere with diagnostic procedures.  Nipple or breast piercing can increase risks of infection.  This procedure is best done when the breast is fully developed.  Changes in the  breast will continue to occur over time.  Pregnancy can alter the outcomes of previous breast reduction surgery, weight gain and weigh loss can also effect the long term appearance.     Electronically signed by: Honora Seip, PA-C 05/28/2024 3:19 PM

## 2024-05-28 NOTE — H&P (View-Only) (Signed)
 Patient ID: Jennifer White, female    DOB: [birth year], 53 y.o.   MRN: [id number]  Chief Complaint  Patient presents with   Pre-op Exam      ICD-10-CM   1. Symptomatic mammary hypertrophy  N62        History of Present Illness: Jennifer White is a 53 y.o.  female  with a history of macromastia.  She presents for preoperative evaluation for upcoming procedure, bilateral breast reduction with possible liposuction, scheduled for 06/19/2024 with Dr. Lowery.  The patient has not had problems with anesthesia.  Previous surgeries without complication.  Mammogram up-to-date.  She denies any personal or family history of blood clots or clotting disorder.  Denies any personal history of cancer, severe cardiac or pulmonary disease, greater than 3 miscarriages, need for anticoagulation, or nicotine use disorder.  She confirms that she has an E cup and like to be as small as safely possible postoperatively.  She does endorse scattered varicosities.  Patient to hold vitamins/supplements 5 days prior to surgery.  Summary of Previous Visit: She was seen for initial consult 04/03/2024.  At that time, complained of chronic upper back and neck discomfort in the context of large breasts.  STN 36 cm on the right, 38 cm on the left.  Preoperative bra size equals E cup.  BMI equaled 35.4 kg/m.  Estimated tissue to be removed at time of surgery equals 400-450 g each side.  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  She also is in a masters program.  Not requesting FMLA/STD at this time as she is only using PTO for a few days after surgery, but will provide work/school letters if needed.  PMH Significant for: Macromastia, history of cholecystectomy and partial hysterectomy.   Past Medical History: Allergies: Allergies  Allergen Reactions   Ciprofloxacin      Leg cramping and mental status changes    Current Medications:  Current Outpatient Medications:     cephALEXin  (KEFLEX ) 500 MG capsule, Take 1 capsule (500 mg total) by mouth 4 (four) times daily for 3 days., Disp: 12 capsule, Rfl: 0   cholecalciferol (VITAMIN D ) 1000 units tablet, Take 1,000 Units by mouth daily. Take 5000 units daily, Disp: , Rfl:    Multiple Vitamin (MULTIVITAMIN) capsule, Take 1 capsule by mouth daily. Taking every few days, Disp: , Rfl:    ondansetron  (ZOFRAN -ODT) 4 MG disintegrating tablet, Take 1 tablet (4 mg total) by mouth every 8 (eight) hours as needed for nausea or vomiting., Disp: 20 tablet, Rfl: 0   oxyCODONE  (ROXICODONE ) 5 MG immediate release tablet, Take 1 tablet (5 mg total) by mouth every 8 (eight) hours as needed for up to 5 days for severe pain (pain score 7-10)., Disp: 15 tablet, Rfl: 0   vitamin C (ASCORBIC ACID) 500 MG tablet, Take 500 mg by mouth daily., Disp: , Rfl:    vitamin E 180 MG (400 UNITS) capsule, Take 400 Units by mouth daily., Disp: , Rfl:   Past Medical Problems: Past Medical History:  Diagnosis Date   Anemia    Asthma    Endometriosis    Fibroid    Lactose intolerance    Vitamin D  deficiency     Past Surgical History: Past Surgical History:  Procedure Laterality Date   CESAREAN SECTION     1993/2002   CHOLECYSTECTOMY, LAPAROSCOPIC  2004   COLONOSCOPY  2011   normal   COLONOSCOPY  03/20/2019   DILATION AND CURETTAGE OF  UTERUS  1988   septic AB/PID/+GC   LAPAROSCOPIC SUPRACERVICAL HYSTERECTOMY  09/24/2013   and bilateral salpingectomy, minilaparotomy and cystoscopy   OOPHORECTOMY  10/28/2013   left for ovarian torsion    Social History: Social History   Socioeconomic History   Marital status: Married    Spouse name: Not on file   Number of children: 2   Years of education: Not on file   Highest education level: Bachelor's degree (e.g., BA, AB, BS)  Occupational History   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  Tobacco Use   Smoking status: Never   Smokeless tobacco: Never  Vaping Use   Vaping status: Never Used   Substance and Sexual Activity   Alcohol use: No   Drug use: No   Sexual activity: Yes    Partners: Male    Birth control/protection: Surgical    Comment: Hysterectomy  Other Topics Concern   Not on file  Social History Narrative   Not on file   Social Drivers of Health   Financial Resource Strain: Low Risk  (08/15/2023)   Overall Financial Resource Strain (CARDIA)    Difficulty of Paying Living Expenses: Not hard at all  Food Insecurity: No Food Insecurity (08/15/2023)   Hunger Vital Sign    Worried About Running Out of Food in the Last Year: Never true    Ran Out of Food in the Last Year: Never true  Transportation Needs: No Transportation Needs (08/15/2023)   PRAPARE - Administrator, Civil Service (Medical): No    Lack of Transportation (Non-Medical): No  Physical Activity: Insufficiently Active (08/15/2023)   Exercise Vital Sign    Days of Exercise per Week: 3 days    Minutes of Exercise per Session: 30 min  Stress: No Stress Concern Present (08/15/2023)   [employer] of Occupational Health - Occupational Stress Questionnaire    Feeling of Stress : Not at all  Social Connections: Socially Integrated (08/15/2023)   Social Connection and Isolation Panel    Frequency of Communication with Friends and Family: More than three times a week    Frequency of Social Gatherings with Friends and Family: More than three times a week    Attends Religious Services: More than 4 times per year    Active Member of [employer] or Organizations: Yes    Attends Engineer, structural: More than 4 times per year    Marital Status: Married  Catering manager Violence: Not on file    Family History: Family History  Problem Relation Age of Onset   Colon cancer Mother 68   Arthritis Mother    Hypertension Father    Lung cancer Father 42   Atrial fibrillation Father    Colon cancer Paternal Grandmother 33       had a blockage but unsure if colon cancer or stomach  cancer   Diabetes Paternal Grandmother        amputee   Hypertension Paternal Grandmother    Prostate cancer Paternal Grandfather    Breast cancer Neg Hx     Review of Systems: ROS Recent URI, but denies any fevers, chest pain, difficulty breathing, leg swelling, or other concerns.  Physical Exam: Vital Signs BP 121/81 (BP Location: Left Arm, Patient Position: Sitting, Cuff Size: Large)   Pulse 80   Ht 5' 0.05 (1.525 m)   Wt 188 lb (85.3 kg)   SpO2 99%   BMI 36.66 kg/m   Physical Exam Constitutional:      General:  Not in acute distress.    Appearance: Normal appearance. Not ill-appearing.  HENT:     Head: Normocephalic and atraumatic.  Eyes:     Pupils: Pupils are equal, round. Cardiovascular:     Rate and Rhythm: Normal rate.    Pulses: Normal pulses.  Pulmonary:     Effort: No respiratory distress or increased work of breathing.  Speaks in full sentences. Abdominal:     General: Abdomen is flat. No distension.   Musculoskeletal: Normal range of motion. No lower extremity swelling or edema.  Scattered varicosities. Skin:    General: Skin is warm and dry.     Findings: No erythema or rash.  Neurological:     Mental Status: Alert and oriented to person, place, and time.  Psychiatric:        Mood and Affect: Mood normal.        Behavior: Behavior normal.    Assessment/Plan: The patient is scheduled for bilateral breast reduction with possible liposuction with Dr. Lowery.  Risks, benefits, and alternatives of procedure discussed, questions answered and consent obtained.    Smoking Status: Non-smoker.  Last Mammogram: 04/2024; Results: BI-RADS Category 1: Negative.  Caprini Score: 5; Risk Factors include: Age, BMI greater than 25, scattered varicosities, and length of planned surgery. Recommendation for mechanical prophylaxis. Encourage early ambulation.   Pictures obtained: 04/03/2024  Post-op Rx sent to pharmacy: Oxycodone , Keflex , Zofran .  Patient was  provided with the General Surgical Risk consent document and Pain Medication Agreement prior to their appointment.  They had adequate time to read through the risk consent documents and Pain Medication Agreement. We also discussed them in person together during this preop appointment. All of their questions were answered to their satisfaction.  Recommended calling if they have any further questions.  Risk consent form and Pain Medication Agreement to be scanned into patient's chart.  The risk that can be encountered with breast reduction were discussed and include the following but not limited to these:  Breast asymmetry, fluid accumulation, firmness of the breast, inability to breast feed, loss of nipple or areola, skin loss, decrease or no nipple sensation, fat necrosis of the breast tissue, bleeding, infection, healing delay.  There are risks of anesthesia, changes to skin sensation and injury to nerves or blood vessels.  The muscle can be temporarily or permanently injured.  You may have an allergic reaction to tape, suture, glue, blood products which can result in skin discoloration, swelling, pain, skin lesions, poor healing.  Any of these can lead to the need for revisonal surgery or stage procedures.  A reduction has potential to interfere with diagnostic procedures.  Nipple or breast piercing can increase risks of infection.  This procedure is best done when the breast is fully developed.  Changes in the breast will continue to occur over time.  Pregnancy can alter the outcomes of previous breast reduction surgery, weight gain and weigh loss can also effect the long term appearance.  We also discussed the possibility of nipple areolar necrosis or the need to perform free nipple graft.    Electronically signed by: Honora Seip, PA-C 05/29/2024 9:24 AM

## 2024-05-29 ENCOUNTER — Ambulatory Visit (INDEPENDENT_AMBULATORY_CARE_PROVIDER_SITE_OTHER): Admitting: Physician Assistant

## 2024-05-29 VITALS — BP 121/81 | HR 80 | Ht 60.05 in | Wt 188.0 lb

## 2024-05-29 DIAGNOSIS — N62 Hypertrophy of breast: Secondary | ICD-10-CM

## 2024-05-29 MED ORDER — ONDANSETRON 4 MG PO TBDP: 4.0000 mg | ORAL_TABLET | Freq: Three times a day (TID) | ORAL | 0 refills | Status: DC | PRN

## 2024-05-29 MED ORDER — CEPHALEXIN 500 MG PO CAPS: 500.0000 mg | ORAL_CAPSULE | Freq: Four times a day (QID) | ORAL | 0 refills | Status: AC

## 2024-05-29 MED ORDER — OXYCODONE HCL 5 MG PO TABS: 5.0000 mg | ORAL_TABLET | Freq: Three times a day (TID) | ORAL | 0 refills | Status: AC | PRN

## 2024-06-12 ENCOUNTER — Encounter (HOSPITAL_BASED_OUTPATIENT_CLINIC_OR_DEPARTMENT_OTHER): Payer: Self-pay | Admitting: Plastic Surgery

## 2024-06-12 ENCOUNTER — Other Ambulatory Visit: Payer: Self-pay

## 2024-06-19 ENCOUNTER — Other Ambulatory Visit: Payer: Self-pay

## 2024-06-19 ENCOUNTER — Encounter (HOSPITAL_BASED_OUTPATIENT_CLINIC_OR_DEPARTMENT_OTHER): Payer: Self-pay | Admitting: Plastic Surgery

## 2024-06-19 ENCOUNTER — Ambulatory Visit (HOSPITAL_BASED_OUTPATIENT_CLINIC_OR_DEPARTMENT_OTHER)
Admission: RE | Admit: 2024-06-19 | Discharge: 2024-06-19 | Disposition: A | Discharge status: Home / Self Care | Attending: Plastic Surgery | Admitting: Plastic Surgery

## 2024-06-19 ENCOUNTER — Ambulatory Visit (HOSPITAL_BASED_OUTPATIENT_CLINIC_OR_DEPARTMENT_OTHER): Admitting: Anesthesiology

## 2024-06-19 ENCOUNTER — Encounter (HOSPITAL_BASED_OUTPATIENT_CLINIC_OR_DEPARTMENT_OTHER)
Admission: RE | Disposition: A | Discharge status: Home / Self Care | Payer: Self-pay | Source: Home / Self Care | Attending: Plastic Surgery

## 2024-06-19 DIAGNOSIS — N6012 Diffuse cystic mastopathy of left breast: Secondary | ICD-10-CM | POA: Diagnosis not present

## 2024-06-19 DIAGNOSIS — M542 Cervicalgia: Secondary | ICD-10-CM | POA: Diagnosis not present

## 2024-06-19 DIAGNOSIS — M549 Dorsalgia, unspecified: Secondary | ICD-10-CM | POA: Insufficient documentation

## 2024-06-19 DIAGNOSIS — N6011 Diffuse cystic mastopathy of right breast: Secondary | ICD-10-CM | POA: Diagnosis not present

## 2024-06-19 DIAGNOSIS — J45909 Unspecified asthma, uncomplicated: Secondary | ICD-10-CM | POA: Diagnosis not present

## 2024-06-19 DIAGNOSIS — N62 Hypertrophy of breast: Secondary | ICD-10-CM

## 2024-06-19 DIAGNOSIS — Z01818 Encounter for other preprocedural examination: Secondary | ICD-10-CM

## 2024-06-19 HISTORY — PX: BREAST REDUCTION SURGERY: SHX8

## 2024-06-19 SURGERY — BREAST REDUCTION WITH LIPOSUCTION
Anesthesia: General | Site: Breast | Laterality: Bilateral

## 2024-06-19 MED ORDER — SODIUM CHLORIDE 0.9% FLUSH: 3.0000 mL | Freq: Two times a day (BID) | INTRAVENOUS | Status: DC

## 2024-06-19 MED ORDER — SCOPOLAMINE 1 MG/3DAYS TD PT72
MEDICATED_PATCH | TRANSDERMAL | Status: AC
Start: 2024-06-19 — End: 2024-06-19
  Filled 2024-06-19: qty 1

## 2024-06-19 MED ORDER — MIDAZOLAM HCL 2 MG/2ML IJ SOLN
INTRAMUSCULAR | Status: AC
  Filled 2024-06-19: qty 2

## 2024-06-19 MED ORDER — FENTANYL CITRATE (PF) 100 MCG/2ML IJ SOLN
INTRAMUSCULAR | Status: DC | PRN
  Administered 2024-06-19: 100 ug via INTRAVENOUS

## 2024-06-19 MED ORDER — ATROPINE SULFATE 0.4 MG/ML IV SOLN
INTRAVENOUS | Status: AC
  Filled 2024-06-19: qty 1

## 2024-06-19 MED ORDER — SCOPOLAMINE 1 MG/3DAYS TD PT72
1.0000 | MEDICATED_PATCH | TRANSDERMAL | Status: DC
  Administered 2024-06-19: 1 mg via TRANSDERMAL

## 2024-06-19 MED ORDER — LIDOCAINE-EPINEPHRINE 1 %-1:100000 IJ SOLN
INTRAMUSCULAR | Status: DC | PRN
  Administered 2024-06-19: 50 mL

## 2024-06-19 MED ORDER — LIDOCAINE HCL (PF) 1 % IJ SOLN
INTRAMUSCULAR | Status: AC
  Filled 2024-06-19: qty 60

## 2024-06-19 MED ORDER — ACETAMINOPHEN 325 MG PO TABS: 650.0000 mg | ORAL_TABLET | ORAL | Status: DC | PRN

## 2024-06-19 MED ORDER — DROPERIDOL 2.5 MG/ML IJ SOLN: 0.6250 mg | Freq: Once | INTRAMUSCULAR | Status: DC | PRN

## 2024-06-19 MED ORDER — PROPOFOL 10 MG/ML IV BOLUS
INTRAVENOUS | Status: DC | PRN
Start: 2024-06-19 — End: 2024-06-19
  Administered 2024-06-19: 170 mg via INTRAVENOUS

## 2024-06-19 MED ORDER — HYDROMORPHONE HCL 1 MG/ML IJ SOLN: 0.2500 mg | INTRAMUSCULAR | Status: DC | PRN

## 2024-06-19 MED ORDER — SODIUM CHLORIDE 0.9% FLUSH: 3.0000 mL | INTRAVENOUS | Status: DC | PRN

## 2024-06-19 MED ORDER — EPINEPHRINE PF 1 MG/ML IJ SOLN
INTRAMUSCULAR | Status: AC
  Filled 2024-06-19: qty 1

## 2024-06-19 MED ORDER — PHENYLEPHRINE 80 MCG/ML (10ML) SYRINGE FOR IV PUSH (FOR BLOOD PRESSURE SUPPORT)
PREFILLED_SYRINGE | INTRAVENOUS | Status: AC
  Filled 2024-06-19: qty 10

## 2024-06-19 MED ORDER — ACETAMINOPHEN 325 MG RE SUPP: 650.0000 mg | RECTAL | Status: DC | PRN

## 2024-06-19 MED ORDER — HYDROMORPHONE HCL 1 MG/ML IJ SOLN
INTRAMUSCULAR | Status: DC | PRN
  Administered 2024-06-19: .5 mg via INTRAVENOUS

## 2024-06-19 MED ORDER — LIDOCAINE HCL (CARDIAC) PF 100 MG/5ML IV SOSY
PREFILLED_SYRINGE | INTRAVENOUS | Status: DC | PRN
  Administered 2024-06-19: 100 mg via INTRAVENOUS

## 2024-06-19 MED ORDER — LIDOCAINE HCL 1 % IJ SOLN
INTRAVENOUS | Status: DC | PRN
  Administered 2024-06-19: 200 mL

## 2024-06-19 MED ORDER — LIDOCAINE 2% (20 MG/ML) 5 ML SYRINGE
INTRAMUSCULAR | Status: AC
  Filled 2024-06-19: qty 5

## 2024-06-19 MED ORDER — CEFAZOLIN SODIUM-DEXTROSE 2-4 GM/100ML-% IV SOLN
INTRAVENOUS | Status: AC
  Filled 2024-06-19: qty 100

## 2024-06-19 MED ORDER — DEXAMETHASONE SODIUM PHOSPHATE 4 MG/ML IJ SOLN
INTRAMUSCULAR | Status: DC | PRN
  Administered 2024-06-19: 5 mg via INTRAVENOUS

## 2024-06-19 MED ORDER — SODIUM CHLORIDE 0.9 % IV SOLN: 250.0000 mL | INTRAVENOUS | Status: DC | PRN

## 2024-06-19 MED ORDER — ROCURONIUM BROMIDE 10 MG/ML (PF) SYRINGE
PREFILLED_SYRINGE | INTRAVENOUS | Status: AC
Start: 2024-06-19 — End: 2024-06-19
  Filled 2024-06-19: qty 10

## 2024-06-19 MED ORDER — OXYCODONE HCL 5 MG PO TABS: 5.0000 mg | ORAL_TABLET | ORAL | Status: DC | PRN

## 2024-06-19 MED ORDER — EPHEDRINE 5 MG/ML INJ
INTRAVENOUS | Status: AC
  Filled 2024-06-19: qty 5

## 2024-06-19 MED ORDER — VASHE WOUND IRRIGATION OPTIME
TOPICAL | Status: DC | PRN
  Administered 2024-06-19: 34 [oz_av] via TOPICAL

## 2024-06-19 MED ORDER — ONDANSETRON HCL 4 MG/2ML IJ SOLN
INTRAMUSCULAR | Status: DC | PRN
  Administered 2024-06-19: 4 mg via INTRAVENOUS

## 2024-06-19 MED ORDER — PHENYLEPHRINE HCL (PRESSORS) 10 MG/ML IV SOLN
INTRAVENOUS | Status: DC | PRN
  Administered 2024-06-19: 80 ug via INTRAVENOUS
  Administered 2024-06-19: 160 ug via INTRAVENOUS

## 2024-06-19 MED ORDER — PROPOFOL 500 MG/50ML IV EMUL
INTRAVENOUS | Status: DC | PRN
  Administered 2024-06-19: 100 ug/kg/min via INTRAVENOUS

## 2024-06-19 MED ORDER — HYDROMORPHONE HCL 1 MG/ML IJ SOLN
INTRAMUSCULAR | Status: AC
  Filled 2024-06-19: qty 0.5

## 2024-06-19 MED ORDER — SUGAMMADEX SODIUM 200 MG/2ML IV SOLN
INTRAVENOUS | Status: DC | PRN
Start: 2024-06-19 — End: 2024-06-19
  Administered 2024-06-19: 170 mg via INTRAVENOUS

## 2024-06-19 MED ORDER — CEFAZOLIN SODIUM-DEXTROSE 2-4 GM/100ML-% IV SOLN
2.0000 g | INTRAVENOUS | Status: AC
  Administered 2024-06-19: 2 g via INTRAVENOUS

## 2024-06-19 MED ORDER — FENTANYL CITRATE (PF) 100 MCG/2ML IJ SOLN
INTRAMUSCULAR | Status: AC
  Filled 2024-06-19: qty 2

## 2024-06-19 MED ORDER — CHLORHEXIDINE GLUCONATE CLOTH 2 % EX PADS: 6.0000 | MEDICATED_PAD | Freq: Once | CUTANEOUS | Status: DC

## 2024-06-19 MED ORDER — DEXMEDETOMIDINE HCL IN NACL 80 MCG/20ML IV SOLN
INTRAVENOUS | Status: AC
  Filled 2024-06-19: qty 20

## 2024-06-19 MED ORDER — ROCURONIUM BROMIDE 100 MG/10ML IV SOLN
INTRAVENOUS | Status: DC | PRN
  Administered 2024-06-19: 60 mg via INTRAVENOUS

## 2024-06-19 MED ORDER — FENTANYL CITRATE (PF) 100 MCG/2ML IJ SOLN: 25.0000 ug | INTRAMUSCULAR | Status: DC | PRN

## 2024-06-19 MED ORDER — DEXAMETHASONE SODIUM PHOSPHATE 10 MG/ML IJ SOLN
INTRAMUSCULAR | Status: AC
  Filled 2024-06-19: qty 1

## 2024-06-19 MED ORDER — DEXMEDETOMIDINE HCL IN NACL 80 MCG/20ML IV SOLN
INTRAVENOUS | Status: DC | PRN
Start: 2024-06-19 — End: 2024-06-19
  Administered 2024-06-19: 8 ug via INTRAVENOUS

## 2024-06-19 MED ORDER — BUPIVACAINE-EPINEPHRINE (PF) 0.5% -1:200000 IJ SOLN
INTRAMUSCULAR | Status: DC | PRN
  Administered 2024-06-19: 40 mL

## 2024-06-19 MED ORDER — BUPIVACAINE LIPOSOME 1.3 % IJ SUSP
INTRAMUSCULAR | Status: AC
  Filled 2024-06-19: qty 20

## 2024-06-19 MED ORDER — SUCCINYLCHOLINE CHLORIDE 200 MG/10ML IV SOSY
PREFILLED_SYRINGE | INTRAVENOUS | Status: AC
  Filled 2024-06-19: qty 10

## 2024-06-19 MED ORDER — ACETAMINOPHEN 500 MG PO TABS
ORAL_TABLET | ORAL | Status: AC
  Filled 2024-06-19: qty 2

## 2024-06-19 MED ORDER — ONDANSETRON HCL 4 MG/2ML IJ SOLN
INTRAMUSCULAR | Status: AC
  Filled 2024-06-19: qty 2

## 2024-06-19 MED ORDER — LACTATED RINGERS IV SOLN: INTRAVENOUS | Status: DC

## 2024-06-19 MED ORDER — ACETAMINOPHEN 500 MG PO TABS
1000.0000 mg | ORAL_TABLET | Freq: Once | ORAL | Status: AC
  Administered 2024-06-19: 1000 mg via ORAL

## 2024-06-19 MED ORDER — LIDOCAINE-EPINEPHRINE 1 %-1:100000 IJ SOLN
INTRAMUSCULAR | Status: AC
  Filled 2024-06-19: qty 2

## 2024-06-19 MED ORDER — MIDAZOLAM HCL 5 MG/5ML IJ SOLN
INTRAMUSCULAR | Status: DC | PRN
  Administered 2024-06-19: 2 mg via INTRAVENOUS

## 2024-06-19 MED ORDER — BUPIVACAINE HCL (PF) 0.25 % IJ SOLN
INTRAMUSCULAR | Status: AC
  Filled 2024-06-19: qty 60

## 2024-06-19 SURGICAL SUPPLY — 68 items
BINDER BREAST LRG (GAUZE/BANDAGES/DRESSINGS) IMPLANT
BINDER BREAST MEDIUM (GAUZE/BANDAGES/DRESSINGS) IMPLANT
BINDER BREAST XLRG (GAUZE/BANDAGES/DRESSINGS) IMPLANT
BINDER BREAST XXLRG (GAUZE/BANDAGES/DRESSINGS) IMPLANT
BIOPATCH RED 1 DISK 7.0 (GAUZE/BANDAGES/DRESSINGS) IMPLANT
BLADE HEX COATED 2.75 (ELECTRODE) IMPLANT
BLADE KNIFE PERSONA 10 (BLADE) ×2 IMPLANT
BLADE SURG 15 STRL LF DISP TIS (BLADE) ×1 IMPLANT
CANISTER SUCT 1200ML W/VALVE (MISCELLANEOUS) ×1 IMPLANT
CLEANSER WND VASHE 34 (WOUND CARE) ×1 IMPLANT
COTTONBALL LRG STERILE PKG (GAUZE/BANDAGES/DRESSINGS) IMPLANT
COVER BACK TABLE 60X90IN (DRAPES) ×1 IMPLANT
COVER MAYO STAND STRL (DRAPES) ×1 IMPLANT
DERMABOND ADVANCED .7 DNX12 (GAUZE/BANDAGES/DRESSINGS) ×2 IMPLANT
DRAIN CHANNEL 15F RND FF W/TCR (WOUND CARE) IMPLANT
DRAIN CHANNEL 19F RND (DRAIN) IMPLANT
DRAPE LAPAROSCOPIC ABDOMINAL (DRAPES) ×1 IMPLANT
DRAPE UTILITY XL STRL (DRAPES) ×1 IMPLANT
DRSG MEPILEX POST OP 4X8 (GAUZE/BANDAGES/DRESSINGS) ×2 IMPLANT
DRSG TEGADERM 4X4.75 (GAUZE/BANDAGES/DRESSINGS) IMPLANT
DRSG TELFA 3X8 NADH STRL (GAUZE/BANDAGES/DRESSINGS) IMPLANT
ELECTRODE BLDE 4.0 EZ CLN MEGD (MISCELLANEOUS) ×1 IMPLANT
ELECTRODE REM PT RTRN 9FT ADLT (ELECTROSURGICAL) ×1 IMPLANT
EVACUATOR SILICONE 100CC (DRAIN) IMPLANT
GAUZE PAD ABD 8X10 STRL (GAUZE/BANDAGES/DRESSINGS) ×2 IMPLANT
GAUZE XEROFORM 5X9 LF (GAUZE/BANDAGES/DRESSINGS) IMPLANT
GLOVE BIO SURGEON STRL SZ 6.5 (GLOVE) ×2 IMPLANT
GLOVE BIO SURGEON STRL SZ7.5 (GLOVE) ×1 IMPLANT
GLOVE BIOGEL PI IND STRL 7.0 (GLOVE) IMPLANT
GLOVE BIOGEL PI IND STRL 8 (GLOVE) IMPLANT
GOWN STRL REUS W/ TWL LRG LVL3 (GOWN DISPOSABLE) ×2 IMPLANT
GOWN STRL REUS W/ TWL XL LVL3 (GOWN DISPOSABLE) IMPLANT
LINER CANISTER 1000CC FLEX (MISCELLANEOUS) ×1 IMPLANT
NDL FILTER BLUNT 18X1 1/2 (NEEDLE) IMPLANT
NDL HYPO 25X1 1.5 SAFETY (NEEDLE) ×2 IMPLANT
NEEDLE FILTER BLUNT 18X1 1/2 (NEEDLE) IMPLANT
NEEDLE HYPO 25X1 1.5 SAFETY (NEEDLE) ×2 IMPLANT
NS IRRIG 1000ML POUR BTL (IV SOLUTION) IMPLANT
PACK BASIN DAY SURGERY FS (CUSTOM PROCEDURE TRAY) ×1 IMPLANT
PAD ALCOHOL SWAB (MISCELLANEOUS) IMPLANT
PAD FOAM SILICONE BACKED (GAUZE/BANDAGES/DRESSINGS) IMPLANT
PENCIL SMOKE EVACUATOR (MISCELLANEOUS) ×1 IMPLANT
PIN SAFETY STERILE (MISCELLANEOUS) IMPLANT
POWDER MYRIAD MORCELLS 1000MG (Miscellaneous) IMPLANT
POWDER MYRIAD MORCLLS FINE 500 (Miscellaneous) IMPLANT
SLEEVE SCD COMPRESS KNEE MED (STOCKING) ×1 IMPLANT
SOL PREP POV-IOD 4OZ 10% (MISCELLANEOUS) IMPLANT
SPIKE FLUID TRANSFER (MISCELLANEOUS) IMPLANT
SPONGE T-LAP 18X18 ~~LOC~~+RFID (SPONGE) ×2 IMPLANT
STRIP SUTURE WOUND CLOSURE 1/2 (MISCELLANEOUS) ×4 IMPLANT
SUT MNCRL AB 4-0 PS2 18 (SUTURE) ×4 IMPLANT
SUT MON AB 3-0 SH27 (SUTURE) ×4 IMPLANT
SUT MON AB 5-0 PS2 18 (SUTURE) IMPLANT
SUT PDS II 3-0 CT2 27 ABS (SUTURE) ×4 IMPLANT
SUT PLAIN GUT FAST 5-0 (SUTURE) IMPLANT
SUT SILK 3 0 PS 1 (SUTURE) IMPLANT
SUT VIC AB 4-0 PS2 27 (SUTURE) IMPLANT
SYR 50ML LL SCALE MARK (SYRINGE) IMPLANT
SYR BULB IRRIG 60ML STRL (SYRINGE) ×1 IMPLANT
SYR CONTROL 10ML LL (SYRINGE) ×2 IMPLANT
TAPE MEASURE VINYL STERILE (MISCELLANEOUS) IMPLANT
TOWEL GREEN STERILE FF (TOWEL DISPOSABLE) ×3 IMPLANT
TRAY DSU PREP LF (CUSTOM PROCEDURE TRAY) ×1 IMPLANT
TUBE CONNECTING 20X1/4 (TUBING) ×1 IMPLANT
TUBING INFILTRATION IT-10001 (TUBING) IMPLANT
TUBING SET GRADUATE ASPIR 12FT (MISCELLANEOUS) IMPLANT
UNDERPAD 30X36 HEAVY ABSORB (UNDERPADS AND DIAPERS) ×2 IMPLANT
YANKAUER SUCT BULB TIP NO VENT (SUCTIONS) ×1 IMPLANT

## 2024-06-19 NOTE — Op Note (Signed)
 Breast Reduction Op note:    DATE OF PROCEDURE: 06/19/2024  LOCATION: [facility]  SURGEON: Estefana Fritter, DO  ASSISTANT: Estefana Peck, PA  PREOPERATIVE DIAGNOSIS 1. Macromastia 2. Neck Pain 3. Back Pain  POSTOPERATIVE DIAGNOSIS 1. Macromastia 2. Neck Pain 3. Back Pain  PROCEDURES 1. Bilateral breast reduction.  Right reduction 605 g, Left reduction 683 g  COMPLICATIONS: None.  DRAINS: none  INDICATIONS FOR PROCEDURE Jennifer White is a 53 y.o. year-old female born on 1971-01-01,with a history of symptomatic macromastia with concomitant back pain, neck pain, shoulder grooving from her bra.   MRN: [id number]  CONSENT Informed consent was obtained directly from the patient. The risks, benefits and alternatives were fully discussed. Specific risks including but not limited to bleeding, infection, hematoma, seroma, scarring, pain, nipple necrosis, asymmetry, poor cosmetic results, and need for further surgery were discussed. The patient's questions were answered.  DESCRIPTION OF PROCEDURE  Patient was brought into the operating room and rested on the operating room table in the supine position.  SCDs were placed and appropriate padding was performed.  Antibiotics were given. The patient underwent general anesthesia and the chest was prepped and draped in a sterile fashion.  A timeout was performed and all information was confirmed to be correct by those in the room. Tumescent was placed in the lateral breast area on each side and then liposuction was done for contour.   Right side: Preoperative markings were confirmed.  Incision lines were injected with local containing epinephrine .  After waiting for vasoconstriction, the marked lines were incised with a #15 blade.  A Wise-pattern superomedial breast reduction was performed by de-epithelializing the pedicle, using bovie to create the superomedial pedicle, and removing breast tissue from the superior,  lateral, and inferior portions of the breast.  Experel and Myriad placed in the pocket. Care was taken to not undermine the breast pedicle. Hemostasis was achieved.  The nipple was gently rotated into position and the soft tissue closed with 4-0 Monocryl.   The pocket was irrigated and hemostasis confirmed.  The deep tissues were approximated with 3-0 PDS sutures.  The skin was closed with deep dermal 3-0 Monocryl and subcuticular 4-0 Monocryl sutures.  The nipple and skin flaps had good capillary refill at the end of the procedure.    Left side: Preoperative markings were confirmed.  Incision lines were injected with local containing epinephrine .  After waiting for vasoconstriction, the marked lines were incised with a #15 blade.  A Wise-pattern superomedial breast reduction was performed by de-epithelializing the pedicle, using bovie to create the superomedial pedicle, and removing breast tissue from the superior, lateral, and inferior portions of the breast.  Experel and Myriad were placed in the pocket. Care was taken to not undermine the breast pedicle. Hemostasis was achieved.  The nipple was gently rotated into position and the soft tissue was closed with 4-0 Monocryl.  The patient was sat upright and size and shape symmetry was confirmed.  The pocket was irrigated and hemostasis confirmed.  The deep tissues were approximated with 3-0 PDS sutures. The skin was closed with deep dermal 3-0 Monocryl and subcuticular 4-0 Monocryl sutures.  Dermabond was applied.  A breast binder and ABDs were placed.  The nipple and skin flaps had good capillary refill at the end of the procedure.  The patient tolerated the procedure well. The patient was allowed to wake from anesthesia and taken to the recovery room in satisfactory condition.  The advanced practice practitioner (APP)  assisted throughout the case.  The APP was essential in retraction and counter traction when needed to make the case progress smoothly.  This  retraction and assistance made it possible to see the tissue plans for the procedure.  The assistance was needed for blood control, tissue re-approximation and assisted with closure of the incision site.

## 2024-06-19 NOTE — Discharge Instructions (Addendum)
 INSTRUCTIONS FOR AFTER BREAST SURGERY   You will likely have some questions about what to expect following your operation.  The following information will help you and your family understand what to expect when you are discharged from the hospital.  It is important to follow these guidelines to help ensure a smooth recovery and reduce complication.  Postoperative instructions include information on: diet, wound care, medications and physical activity.  AFTER SURGERY Expect to go home after the procedure.  In some cases, you may need to spend one night in the hospital for observation.  DIET Breast surgery does not require a specific diet.  However, the healthier you eat the better your body will heal. It is important to increasing your protein intake.  This means limiting the foods with sugar and carbohydrates.  Focus on vegetables and some meat.  If you have liposuction during your procedure be sure to drink water.  If your urine is bright yellow, then it is concentrated, and you need to drink more water.  As a general rule after surgery, you should have 8 ounces of water every hour while awake.  If you find you are persistently nauseated or unable to take in liquids let us  know.  NO TOBACCO USE or EXPOSURE.  This will slow your healing process and lead to a wound.  WOUND CARE Leave the binder on for 3 days . Use fragrance free soap like Dial, Dove or [place].   After 3 days you can remove the binder to shower. Once dry apply binder or sports bra. If you have liposuction you will have a soft and spongy dressing (Lipofoam) that helps prevent creases in your skin.  Remove before you shower and then replace it.  It is also available on [employer]. If you have steri-strips / tape directly attached to your skin leave them in place. It is OK to get these wet.   No baths, pools or hot tubs for four weeks. We close your incision to leave the smallest and best-looking scar. No ointment or creams on your incisions  for four weeks.  No Neosporin (Too many skin reactions).  A few weeks after surgery you can use Mederma and start massaging the scar. We ask you to wear your binder or sports bra for the first 6 weeks around the clock, including while sleeping. This provides added comfort and helps reduce the fluid accumulation at the surgery site. NO Ice or heating pads to the operative site.  You have a very high risk of a BURN before you feel the temperature change.  ACTIVITY No heavy lifting until cleared by the doctor.  This usually means no more than a half-gallon of milk.  It is OK to walk and climb stairs. Moving your legs is very important to decrease your risk of a blood clot.  It will also help keep you from getting deconditioned.  Every 1 to 2 hours get up and walk for 5 minutes. This will help with a quicker recovery back to normal.  Let pain be your guide so you don't do too much.  This time is for you to recover.  You will be more comfortable if you sleep and rest with your head elevated either with a few pillows under you or in a recliner.  No stomach sleeping for a three months.  WORK Everyone returns to work at different times. As a rough guide, most people take at least 1 - 2 weeks off prior to returning to work. If  you need documentation for your job, give the forms to the front staff at the clinic.  DRIVING Arrange for someone to bring you home from the hospital after your surgery.  You may be able to drive a few days after surgery but not while taking any narcotics or valium.  BOWEL MOVEMENTS Constipation can occur after anesthesia and while taking pain medication.  It is important to stay ahead for your comfort.  We recommend taking Milk of Magnesia (2 tablespoons; twice a day) while taking the pain pills.  MEDICATIONS You may be prescribed should start after surgery At your preoperative visit for you history and physical you were given the following medications: Antibiotic: Start this  medication when you get home and take according to the instructions on the bottle. Zofran  4 mg:  This is to treat nausea and vomiting.  You can take this every 6 hours as needed and only if needed. Oxycodone  5 mg every 6 hours for 3 - 5 days.  This is to be used after you have taken the Motrin or the Tylenol . 4.   Gabapentin 300 mg every 12 hours for 7 days.  Over the counter Medication to take: Ibuprofen (Motrin) 400 - 600 mg every 6 hour for 7 days Tylenol  500 mg every 6 hours for 7 days.  Only take the Oxycodone  after you have tried these two. MiraLAX or stool softener of choice: Take this according to the bottle if you take the Norco.  If muscle work done:  Flexeril 5 mg every 12 hours for 7 days.  WHEN TO CALL Call your surgeon's office if any of the following occur: Fever 101 degrees F or greater Excessive bleeding or fluid from the incision site. Pain that increases over time without aid from the medications Redness, warmth, or pus draining from incision sites Persistent nausea or inability to take in liquids Severe misshapen area that underwent the operation.   Post Anesthesia Home Care Instructions  Activity: Get plenty of rest for the remainder of the day. A responsible individual must stay with you for 24 hours following the procedure.  For the next 24 hours, DO NOT: -Drive a car -Advertising copywriter -Drink alcoholic beverages -Take any medication unless instructed by your physician -Make any legal decisions or sign important papers.  Meals: Start with liquid foods such as gelatin or soup. Progress to regular foods as tolerated. Avoid greasy, spicy, heavy foods. If nausea and/or vomiting occur, drink only clear liquids until the nausea and/or vomiting subsides. Call your physician if vomiting continues.  Special Instructions/Symptoms: Your throat may feel dry or sore from the anesthesia or the breathing tube placed in your throat during surgery. If this causes  discomfort, gargle with warm salt water. The discomfort should disappear within 24 hours.  If you had a scopolamine  patch placed behind your ear for the management of post- operative nausea and/or vomiting:  1. The medication in the patch is effective for 72 hours, after which it should be removed.  Wrap patch in a tissue and discard in the trash. Wash hands thoroughly with soap and water. 2. You may remove the patch earlier than 72 hours if you experience unpleasant side effects which may include dry mouth, dizziness or visual disturbances. 3. Avoid touching the patch. Wash your hands with soap and water after contact with the patch.     Information for Discharge Teaching: EXPAREL  (bupivacaine  liposome injectable suspension)   Pain relief is important to your recovery. The goal is to  control your pain so you can move easier and return to your normal activities as soon as possible after your procedure. Your physician may use several types of medicines to manage pain, swelling, and more.  Your surgeon or anesthesiologist gave you EXPAREL (bupivacaine ) to help control your pain after surgery.  EXPAREL  is a local anesthetic designed to release slowly over an extended period of time to provide pain relief by numbing the tissue around the surgical site. EXPAREL  is designed to release pain medication over time and can control pain for up to 72 hours. Depending on how you respond to EXPAREL , you may require less pain medication during your recovery. EXPAREL  can help reduce or eliminate the need for opioids during the first few days after surgery when pain relief is needed the most. EXPAREL  is not an opioid and is not addictive. It does not cause sleepiness or sedation.   Important! A teal colored band has been placed on your arm with the date, time and amount of EXPAREL  you have received. Please leave this armband in place for the full 96 hours following administration, and then you may remove the  band. If you return to the hospital for any reason within 96 hours following the administration of EXPAREL , the armband provides important information that your health care providers to know, and alerts them that you have received this anesthetic.    Possible side effects of EXPAREL : Temporary loss of sensation or ability to move in the area where medication was injected. Nausea, vomiting, constipation Rarely, numbness and tingling in your mouth or lips, lightheadedness, or anxiety may occur. Call your doctor right away if you think you may be experiencing any of these sensations, or if you have other questions regarding possible side effects.  Follow all other discharge instructions given to you by your surgeon or nurse. Eat a healthy diet and drink plenty of water or other fluids.  No tylenol  until 1pm

## 2024-06-19 NOTE — Anesthesia Preprocedure Evaluation (Signed)
 Anesthesia Evaluation  Patient identified by MRN, date of birth, ID band Patient awake    Reviewed: Allergy & Precautions, NPO status , Patient's Chart, lab work & pertinent test results  History of Anesthesia Complications Negative for: history of anesthetic complications  Airway Mallampati: II  TM Distance: >3 FB Neck ROM: Full    Dental no notable dental hx.    Pulmonary asthma    Pulmonary exam normal        Cardiovascular negative cardio ROS Normal cardiovascular exam     Neuro/Psych negative neurological ROS     GI/Hepatic negative GI ROS, Neg liver ROS,,,  Endo/Other  negative endocrine ROS    Renal/GU negative Renal ROS     Musculoskeletal negative musculoskeletal ROS (+)    Abdominal   Peds  Hematology negative hematology ROS (+)   Anesthesia Other Findings   Reproductive/Obstetrics                              Anesthesia Physical Anesthesia Plan  ASA: 2  Anesthesia Plan: General   Post-op Pain Management: Tylenol  PO (pre-op)*   Induction: Intravenous  PONV Risk Score and Plan: 3 and Treatment may vary due to age or medical condition, Ondansetron , Dexamethasone , Midazolam  and Scopolamine  patch - Pre-op  Airway Management Planned: Oral ETT  Additional Equipment: None  Intra-op Plan:   Post-operative Plan: Extubation in OR  Informed Consent: I have reviewed the patients History and Physical, chart, labs and discussed the procedure including the risks, benefits and alternatives for the proposed anesthesia with the patient or authorized representative who has indicated his/her understanding and acceptance.     Dental advisory given  Plan Discussed with: CRNA  Anesthesia Plan Comments:          Anesthesia Quick Evaluation

## 2024-06-19 NOTE — Anesthesia Procedure Notes (Signed)
 Procedure Name: Intubation Date/Time: 06/19/2024 7:33 AM  Performed by: Emilio Rock BIRCH, CRNAPre-anesthesia Checklist: Patient identified, Emergency Drugs available, Suction available and Patient being monitored Patient Re-evaluated:Patient Re-evaluated prior to induction Oxygen Delivery Method: Circle system utilized Preoxygenation: Pre-oxygenation with 100% oxygen Induction Type: IV induction Ventilation: Mask ventilation without difficulty Laryngoscope Size: Mac and 3 Grade View: Grade I Tube type: Oral Tube size: 7.0 mm Number of attempts: 1 Airway Equipment and Method: Stylet and Oral airway Placement Confirmation: ETT inserted through vocal cords under direct vision, positive ETCO2 and breath sounds checked- equal and bilateral Secured at: 22 cm Tube secured with: Tape Dental Injury: Teeth and Oropharynx as per pre-operative assessment

## 2024-06-19 NOTE — Interval H&P Note (Signed)
 History and Physical Interval Note:  06/19/2024 7:00 AM  Jennifer White  has presented today for surgery, with the diagnosis of hypertrophy of breast.  The various methods of treatment have been discussed with the patient and family. After consideration of risks, benefits and other options for treatment, the patient has consented to  Procedure(s): BREAST REDUCTION WITH LIPOSUCTION (Bilateral) as a surgical intervention.  The patient's history has been reviewed, patient examined, no change in status, stable for surgery.  I have reviewed the patient's chart and labs.  Questions were answered to the patient's satisfaction.     Jennifer White

## 2024-06-19 NOTE — Anesthesia Postprocedure Evaluation (Signed)
 Anesthesia Post Note  Patient: Jennifer White  Procedure(s) Performed: BREAST REDUCTION WITH LIPOSUCTION (Bilateral: Breast)     Patient location during evaluation: PACU Anesthesia Type: General Level of consciousness: awake and alert Pain management: pain level controlled Vital Signs Assessment: post-procedure vital signs reviewed and stable Respiratory status: spontaneous breathing, nonlabored ventilation and respiratory function stable Cardiovascular status: blood pressure returned to baseline Postop Assessment: no apparent nausea or vomiting Anesthetic complications: no   No notable events documented.  Last Vitals:  Vitals:   06/19/24 1030 06/19/24 1046  BP: 103/71 112/74  Pulse: 66 70  Resp: 20 20  Temp:  (!) 36.2 C  SpO2: 97% 98%    Last Pain:  Vitals:   06/19/24 1046  TempSrc:   PainSc: 0-No pain                 Vertell Row

## 2024-06-19 NOTE — Transfer of Care (Signed)
 Immediate Anesthesia Transfer of Care Note  Patient: Jennifer White  Procedure(s) Performed: BREAST REDUCTION WITH LIPOSUCTION (Bilateral: Breast)  Patient Location: PACU  Anesthesia Type:General  Level of Consciousness: sedated  Airway & Oxygen Therapy: Patient Spontanous Breathing and Patient connected to face mask oxygen  Post-op Assessment: Report given to RN and Post -op Vital signs reviewed and stable  Post vital signs: Reviewed and stable  Last Vitals:  Vitals Value Taken Time  BP    Temp    Pulse    Resp    SpO2      Last Pain:  Vitals:   06/19/24 0640  TempSrc: Temporal  PainSc: 3       Patients Stated Pain Goal: 5 (06/19/24 0640)  Complications: No notable events documented.

## 2024-06-20 ENCOUNTER — Encounter (HOSPITAL_BASED_OUTPATIENT_CLINIC_OR_DEPARTMENT_OTHER): Payer: Self-pay | Admitting: Plastic Surgery

## 2024-06-20 LAB — SURGICAL PATHOLOGY

## 2024-06-23 ENCOUNTER — Telehealth: Payer: Self-pay | Admitting: Plastic Surgery

## 2024-06-23 NOTE — Telephone Encounter (Signed)
 Please notify patient to place Vaseline over the blister.  It sometimes is from the Steri-Strips.  She can resume her multivitamins.

## 2024-06-23 NOTE — Telephone Encounter (Signed)
 Patient states that she took her first shower after surgery and is saying that she has a blister full of blood on left side, a few inches away from last stitches, not on the suture. Patient would also like to know if she can resume her multi-vitamins, please reach out and advise.

## 2024-06-27 ENCOUNTER — Ambulatory Visit: Admitting: Plastic Surgery

## 2024-06-27 ENCOUNTER — Encounter: Payer: Self-pay | Admitting: Plastic Surgery

## 2024-06-27 VITALS — BP 93/65 | HR 81

## 2024-06-27 DIAGNOSIS — N62 Hypertrophy of breast: Secondary | ICD-10-CM

## 2024-06-27 NOTE — Progress Notes (Signed)
 The patient is a 53 year old female here for follow-up after undergoing a breast reduction.  The patient had over 600 g removed from both breasts.  She is doing well.  No sign of infection.  She has some swelling and bruising as expected.  No sign of a hematoma or seroma.  Continue with sports bra and we will get pictures at her next visit.  She is very pleased with her results.

## 2024-07-11 ENCOUNTER — Ambulatory Visit: Admitting: Surgical

## 2024-07-11 VITALS — BP 96/67 | HR 75

## 2024-07-11 DIAGNOSIS — N62 Hypertrophy of breast: Secondary | ICD-10-CM

## 2024-07-11 NOTE — Progress Notes (Signed)
 53 year old female here for follow-up after breast reduction with Dr. Lowery on 06/19/2024.  She is overall doing well.  Steri-Strips are slowly coming off.  She has no specific complaints or concerns.  She has been staying active with walking and other light activities.  Chaperone present on exam Bilateral NAC is viable, breast incisions are intact and healing well.  Breasts are symmetric.  There is no erythema or cellulitic changes noted.  No subcutaneous fluid collection noted palpation.  A/P:  Patient is doing well, continue with compressive garments.  Avoid strenuous activities or heavy lifting greater than 15 to 20 pounds. Continue to avoid submerging in water.  Recommend following up in 2 to 3 weeks for reevaluation.  There is no signs infection or concern on exam.  Recommend calling with questions or concerns.

## 2024-07-22 ENCOUNTER — Encounter: Admitting: Surgical

## 2024-07-31 ENCOUNTER — Ambulatory Visit: Admitting: Surgical

## 2024-07-31 VITALS — BP 115/78 | HR 73

## 2024-07-31 DIAGNOSIS — G8929 Other chronic pain: Secondary | ICD-10-CM

## 2024-07-31 DIAGNOSIS — Z9889 Other specified postprocedural states: Secondary | ICD-10-CM

## 2024-07-31 DIAGNOSIS — M546 Pain in thoracic spine: Secondary | ICD-10-CM

## 2024-07-31 DIAGNOSIS — N62 Hypertrophy of breast: Secondary | ICD-10-CM

## 2024-07-31 NOTE — Progress Notes (Signed)
 Patient is a 53 y.o.-year-old female status post bilateral breast reduction with Dr.  Lowery. Patient is 6 weeks postop.  Patient reports she is doing well.  She reports he has noticed a few small little bumps along the incision that are tender to palpation.  She reports the Steri-Strips have been slowly coming off.  She is not having any infectious symptoms.   Chaperone present on exam Bilateral NAC's are viable, bilateral breast incisions are intact.  Steri-Strips are still in place. There is no erythema or cellulitic changes noted. No obvious subcutaneous fluid collections noted with palpation. She does have 2 small bumps where sutures are protruding just beneath the skin.  Along the right lateral aspect she does have 1 poking through the skin.  Along the medial breast inframammary fold and left lateral breast inframammary fold she has 2 additional small bumps where sutures are just beneath the skin.  There is no surrounding erythema or cellulitic changes.   A/P:  Discussed with patient that the small bumps are likely deep sutures that have not yet dissolved, discussed with patient that they may protrude to the skin.  If they do, recommend following up for removal.  Otherwise recommend gentle massage.  Discussed recommendations for scar cream.  Discussed removal of Steri-Strips in the next few weeks as they continue to come off.  Recommend continuing with compressive garment 24/7 until 6 weeks post-op,  avoiding strenuous activity/heavy lifting until 6 weeks post-op  Recommend following up as needed  Pictures were obtained of the patient and placed in the chart with the patient's or guardian's permission.  All of the patient's questions were answered to their content. Recommend calling with any questions or concerns.
